# Patient Record
Sex: Female | Born: 1980
Health system: Southern US, Community
[De-identification: ages and names within clinical notes are randomized; demographics above are authoritative.]

## PROBLEM LIST (undated history)

## (undated) DIAGNOSIS — K219 Gastro-esophageal reflux disease without esophagitis: Secondary | ICD-10-CM

## (undated) DIAGNOSIS — G4730 Sleep apnea, unspecified: Secondary | ICD-10-CM

## (undated) HISTORY — PX: LIPOMA EXCISION: SHX5283

## (undated) HISTORY — DX: Sleep apnea, unspecified: G47.30

## (undated) HISTORY — DX: Gastro-esophageal reflux disease without esophagitis: K21.9

---

## 2019-01-22 ENCOUNTER — Other Ambulatory Visit: Payer: Self-pay

## 2019-01-22 ENCOUNTER — Ambulatory Visit (INDEPENDENT_AMBULATORY_CARE_PROVIDER_SITE_OTHER): Payer: 59 | Admitting: Family Medicine

## 2019-01-22 ENCOUNTER — Encounter: Payer: Self-pay | Admitting: Family Medicine

## 2019-01-22 VITALS — BP 126/84 | HR 72 | Resp 16 | Ht 64.0 in | Wt 179.0 lb

## 2019-01-22 DIAGNOSIS — R1013 Epigastric pain: Secondary | ICD-10-CM | POA: Diagnosis not present

## 2019-01-22 DIAGNOSIS — R1012 Left upper quadrant pain: Secondary | ICD-10-CM

## 2019-01-22 DIAGNOSIS — R195 Other fecal abnormalities: Secondary | ICD-10-CM

## 2019-01-22 DIAGNOSIS — K219 Gastro-esophageal reflux disease without esophagitis: Secondary | ICD-10-CM

## 2019-01-22 LAB — COMPREHENSIVE METABOLIC PANEL
ALT: 11 U/L (ref 0–35)
AST: 11 U/L (ref 0–37)
Albumin: 4.4 g/dL (ref 3.5–5.2)
Alkaline Phosphatase: 37 U/L — ABNORMAL LOW (ref 39–117)
BUN: 9 mg/dL (ref 6–23)
CO2: 31 mEq/L (ref 19–32)
Calcium: 9.8 mg/dL (ref 8.4–10.5)
Chloride: 101 mEq/L (ref 96–112)
Creatinine, Ser: 0.69 mg/dL (ref 0.40–1.20)
GFR: 96.25 mL/min (ref >60.00)
Glucose, Bld: 87 mg/dL (ref 70–99)
Potassium: 4.4 mEq/L (ref 3.5–5.1)
Sodium: 138 mEq/L (ref 135–145)
Total Bilirubin: 0.4 mg/dL (ref 0.2–1.2)
Total Protein: 7.2 g/dL (ref 6.0–8.3)

## 2019-01-22 LAB — CBC WITH DIFFERENTIAL/PLATELET
Basophils Absolute: 0 10*3/uL (ref 0.0–0.1)
Basophils Relative: 0.4 % (ref 0.0–3.0)
Eosinophils Absolute: 0.1 10*3/uL (ref 0.0–0.7)
Eosinophils Relative: 1.6 % (ref 0.0–5.0)
HCT: 36.8 % (ref 36.0–46.0)
Hemoglobin: 12.3 g/dL (ref 12.0–15.0)
Lymphocytes Relative: 32.8 % (ref 12.0–46.0)
Lymphs Abs: 1.6 10*3/uL (ref 0.7–4.0)
MCHC: 33.5 g/dL (ref 30.0–36.0)
MCV: 79.5 fl (ref 78.0–100.0)
Monocytes Absolute: 0.3 10*3/uL (ref 0.1–1.0)
Monocytes Relative: 6.4 % (ref 3.0–12.0)
Neutro Abs: 2.9 10*3/uL (ref 1.4–7.7)
Neutrophils Relative %: 58.8 % (ref 43.0–77.0)
Platelets: 402 10*3/uL — ABNORMAL HIGH (ref 150.0–400.0)
RBC: 4.63 Mil/uL (ref 3.87–5.11)
RDW: 14.4 % (ref 11.5–15.5)
WBC: 5 10*3/uL (ref 4.0–10.5)

## 2019-01-22 LAB — LIPASE: Lipase: 13 U/L (ref 11.0–59.0)

## 2019-01-22 MED ORDER — PANTOPRAZOLE SODIUM 20 MG PO TBEC: 20.0000 mg | DELAYED_RELEASE_TABLET | Freq: Every day | ORAL | 1 refills | Status: DC

## 2019-01-22 MED FILL — PANTOPRAZOLE SOD DR 20 MG T: 20 | 90 days supply | Qty: 90 | Fill #0

## 2019-01-22 NOTE — Progress Notes (Signed)
Subjective  CC:  Chief Complaint  Patient presents with  . Abdominal Pain    Started about 2 weeks ago.. Reports that initally thought it was due to food she had ate, had had some vomiting, mostly left & mid abdominal.. Has tried Mag Citrate, & Preg test neg.. Reports BMs had been watery and are now formed.    HPI: Pamela Simpson is a 38 y.o. female who presents to [place] [facility] today due to above. She is new to the area and in the processing of establishing with a pcp.  She has the following concerns or needs:  Has mild h/o GERD self treated w/ protonix although ran out several months ago. Started prilosec intermittently over the last few weeks.  2 weeks ago acute onset of dull aching midepigastric pain/discomfort associated with upset stomach and one episode of vomiting. Then with loose stools, w/o mucous or blood. Also with intermittent LUQ pain and some postprandial discomfort. No further n/v or diarrhea. Actually didn't have a BM for a few days so took Bloomer with some results. Stools are now normalizing but not yet completely formed. Did have some abdominal cramping. No h/o IBS. She drinks occasionally. No nsaids. + stressors due to work environment. No f/c/s. No fam h/o inflammatory bowel disease. No melena. Appetite is normal. No h/o abdominal surgeries.   Assessment  1. Midepigastric pain   2. LUQ pain   3. Loose stools   4. Gastroesophageal reflux disease, esophagitis presence not specified      Plan   Symptoms complex could be due to gastritis but pancreatitis, biliary colic or other are also in the differenetial. Benign abdomen now. Treat with protonix, check labs, immodium or probiotics if needed and consider ultrasound if not improving in 2 weeks or if labs suggest need. Patient understands and agrees with care plan.    Follow up:  prn Orders Placed This Encounter  Procedures  . CBC with Differential/Platelet  . Comprehensive metabolic  panel  . Lipase   Meds ordered this encounter  Medications  . pantoprazole (PROTONIX) 20 MG tablet    Sig: Take 1 tablet (20 mg total) by mouth daily.    Dispense:  90 tablet    Refill:  1     No flowsheet data found.  We updated and reviewed the patient's past history in detail and it is documented below.  Patient Active Problem List   Diagnosis Date Noted  . GERD (gastroesophageal reflux disease)    Health Maintenance  Topic Date Due  . HIV Screening  01/12/1998  . TETANUS/TDAP  01/12/2002  . PAP SMEAR-Modifier  01/13/2004  . INFLUENZA VACCINE  12/21/2018    There is no immunization history on file for this patient. Current Meds  Medication Sig  . Multiple Vitamin (MULTIVITAMIN) capsule Take 1 capsule by mouth daily.    Allergies: Patient has No Known Allergies. Past Medical History Patient  has a past medical history of GERD (gastroesophageal reflux disease). Past Surgical History Patient  has a past surgical history that includes Lipoma excision. Family History: Patient family history includes Healthy in her mother and sister. Social History:  Patient  reports that she has never smoked. She has never used smokeless tobacco. She reports current alcohol use. She reports that she does not use drugs.  Review of Systems: Constitutional: negative for fever or malaise Ophthalmic: negative for photophobia, double vision or loss of vision Cardiovascular: negative for chest pain, dyspnea on exertion, or  new LE swelling Respiratory: negative for SOB or persistent cough Genitourinary: negative for dysuria or gross hematuria Musculoskeletal: negative for new gait disturbance or muscular weakness Integumentary: negative for new or persistent rashes Neurological: negative for TIA or stroke symptoms Psychiatric: negative for SI or delusions Allergic/Immunologic: negative for hives  No care team member to display  Objective  Vitals: BP 126/84   Pulse 72   Resp 16   Ht  5\' 4"  (1.626 m)   Wt 179 lb (81.2 kg)   LMP 01/11/2019   SpO2 98%   BMI 30.73 kg/m  General:  Well developed, well nourished, no acute distress  Psych:  Alert and oriented,normal mood and affect HEENT:  Normocephalic, atraumatic, non-icteric sclera, PERRL, oropharynx is without mass or exudate, supple neck without adenopathy, mass or thyromegaly Cardiovascular:  RRR without gallop, rub or murmur, nondisplaced PMI Respiratory:  Good breath sounds bilaterally, CTAB with normal respiratory effort Gastrointestinal: normal bowel sounds, soft, mild midepigastric and LUQ ttp w/o rebound or guarding, no noted masses. No HSM MSK: no deformities, contusions. Joints are without erythema or swelling Skin:  Warm, no rashes or suspicious lesions noted Neurologic:    Mental status is normal. Gross motor and sensory exams are normal. Normal gait   Commons side effects, risks, benefits, and alternatives for medications and treatment plan prescribed today were discussed, and the patient expressed understanding of the given instructions. Patient is instructed to call or message via MyChart if he/she has any questions or concerns regarding our treatment plan. No barriers to understanding were identified. We discussed Red Flag symptoms and signs in detail. Patient expressed understanding regarding what to do in case of urgent or emergency type symptoms.   Medication list was reconciled, printed and provided to the patient in AVS. Patient instructions and summary information was reviewed with the patient as documented in the AVS. This note was prepared with assistance of Dragon voice recognition software. Occasional wrong-word or sound-a-like substitutions may have occurred due to the inherent limitations of voice recognition software

## 2019-01-22 NOTE — Patient Instructions (Signed)
Please return in 2 weeks if your symptoms are not resolving.   I will release your lab results to you on your MyChart account with further instructions. Please reply with any questions.    If you have any questions or concerns, please don't hesitate to send me a message via MyChart or call the office at [phone]. Thank you for visiting with Pamela Simpson today! It's our pleasure caring for you.   Abdominal Pain, Adult Abdominal pain can be caused by many things. Often, abdominal pain is not serious and it gets better with no treatment or by being treated at home. However, sometimes abdominal pain is serious. Your health care provider will do a medical history and a physical exam to try to determine the cause of your abdominal pain. Follow these instructions at home:  Take over-the-counter and prescription medicines only as told by your health care provider. Do not take a laxative unless told by your health care provider.  Drink enough fluid to keep your urine clear or pale yellow.  Watch your condition for any changes.  Keep all follow-up visits as told by your health care provider. This is important. Contact a health care provider if:  Your abdominal pain changes or gets worse.  You are not hungry or you lose weight without trying.  You are constipated or have diarrhea for more than 2-3 days.  You have pain when you urinate or have a bowel movement.  Your abdominal pain wakes you up at night.  Your pain gets worse with meals, after eating, or with certain foods.  You are throwing up and cannot keep anything down.  You have a fever. Get help right away if:  Your pain does not go away as soon as your health care provider told you to expect.  You cannot stop throwing up.  Your pain is only in areas of the abdomen, such as the right side or the left lower portion of the abdomen.  You have bloody or black stools, or stools that look like tar.  You have severe pain, cramping, or  bloating in your abdomen.  You have signs of dehydration, such as: ? Dark urine, very little urine, or no urine. ? Cracked lips. ? Dry mouth. ? Sunken eyes. ? Sleepiness. ? Weakness. This information is not intended to replace advice given to you by your health care provider. Make sure you discuss any questions you have with your health care provider. Document Released: 02/15/2005 Document Revised: 11/26/2015 Document Reviewed: 10/20/2015 Elsevier Interactive Patient Education  [employer].

## 2019-01-28 ENCOUNTER — Encounter: Payer: Self-pay | Admitting: Family Medicine

## 2019-01-28 DIAGNOSIS — R195 Other fecal abnormalities: Secondary | ICD-10-CM

## 2019-01-28 DIAGNOSIS — R1012 Left upper quadrant pain: Secondary | ICD-10-CM

## 2019-01-28 DIAGNOSIS — R1013 Epigastric pain: Secondary | ICD-10-CM

## 2019-02-06 ENCOUNTER — Ambulatory Visit
Admission: RE | Admit: 2019-02-06 | Discharge: 2019-02-06 | Disposition: A | Discharge status: Home / Self Care | Payer: 59 | Source: Ambulatory Visit | Attending: Family Medicine | Admitting: Family Medicine

## 2019-02-06 DIAGNOSIS — R1011 Right upper quadrant pain: Secondary | ICD-10-CM | POA: Diagnosis not present

## 2019-02-06 DIAGNOSIS — R1013 Epigastric pain: Secondary | ICD-10-CM

## 2019-02-06 DIAGNOSIS — R195 Other fecal abnormalities: Secondary | ICD-10-CM

## 2019-02-06 DIAGNOSIS — R1012 Left upper quadrant pain: Secondary | ICD-10-CM

## 2019-02-21 ENCOUNTER — Telehealth: Payer: Self-pay | Admitting: Family Medicine

## 2019-02-21 DIAGNOSIS — L03011 Cellulitis of right finger: Secondary | ICD-10-CM

## 2019-02-21 MED ORDER — CEPHALEXIN 500 MG PO CAPS: 500.0000 mg | ORAL_CAPSULE | Freq: Two times a day (BID) | ORAL | 0 refills | Status: DC

## 2019-02-21 MED FILL — CEPHALEXIN 500 MG CAPSULE: 500 | 7 days supply | Qty: 14 | Fill #0

## 2019-02-21 NOTE — Telephone Encounter (Signed)
Finger with redness and swelling around lateral edge. Now with mild pus drainage.   Educated and ordered keflex.

## 2019-03-10 DIAGNOSIS — R102 Pelvic and perineal pain: Secondary | ICD-10-CM | POA: Diagnosis not present

## 2019-03-10 DIAGNOSIS — E663 Overweight: Secondary | ICD-10-CM | POA: Diagnosis not present

## 2019-03-10 DIAGNOSIS — N898 Other specified noninflammatory disorders of vagina: Secondary | ICD-10-CM | POA: Diagnosis not present

## 2019-03-10 DIAGNOSIS — Z13220 Encounter for screening for lipoid disorders: Secondary | ICD-10-CM | POA: Diagnosis not present

## 2019-03-10 DIAGNOSIS — Z0000 Encounter for general adult medical examination without abnormal findings: Secondary | ICD-10-CM | POA: Diagnosis not present

## 2019-03-12 DIAGNOSIS — N946 Dysmenorrhea, unspecified: Secondary | ICD-10-CM | POA: Diagnosis not present

## 2019-03-12 DIAGNOSIS — N839 Noninflammatory disorder of ovary, fallopian tube and broad ligament, unspecified: Secondary | ICD-10-CM | POA: Diagnosis not present

## 2019-03-12 DIAGNOSIS — R102 Pelvic and perineal pain: Secondary | ICD-10-CM | POA: Diagnosis not present

## 2019-03-13 DIAGNOSIS — D649 Anemia, unspecified: Secondary | ICD-10-CM | POA: Diagnosis not present

## 2019-03-27 MED FILL — IBUPROFEN 800 MG TABS: 800 | 5 days supply | Qty: 15 | Fill #0

## 2019-04-08 DIAGNOSIS — R109 Unspecified abdominal pain: Secondary | ICD-10-CM | POA: Diagnosis not present

## 2019-04-08 MED FILL — PANTOPRAZOLE SOD DR 40 MG T: 40 | 30 days supply | Qty: 30 | Fill #0

## 2019-05-12 DIAGNOSIS — Z3201 Encounter for pregnancy test, result positive: Secondary | ICD-10-CM | POA: Diagnosis not present

## 2019-05-12 DIAGNOSIS — N910 Primary amenorrhea: Secondary | ICD-10-CM | POA: Diagnosis not present

## 2019-05-12 DIAGNOSIS — N83299 Other ovarian cyst, unspecified side: Secondary | ICD-10-CM | POA: Diagnosis not present

## 2019-05-13 DIAGNOSIS — O09511 Supervision of elderly primigravida, first trimester: Secondary | ICD-10-CM | POA: Diagnosis not present

## 2019-05-13 DIAGNOSIS — O3480 Maternal care for other abnormalities of pelvic organs, unspecified trimester: Secondary | ICD-10-CM | POA: Diagnosis not present

## 2019-06-06 DIAGNOSIS — Z3401 Encounter for supervision of normal first pregnancy, first trimester: Secondary | ICD-10-CM | POA: Diagnosis not present

## 2019-06-06 DIAGNOSIS — Z3201 Encounter for pregnancy test, result positive: Secondary | ICD-10-CM | POA: Diagnosis not present

## 2019-06-06 DIAGNOSIS — Z3400 Encounter for supervision of normal first pregnancy, unspecified trimester: Secondary | ICD-10-CM | POA: Diagnosis not present

## 2019-06-06 LAB — OB RESULTS CONSOLE HEPATITIS B SURFACE ANTIGEN: Hepatitis B Surface Ag: NEGATIVE

## 2019-06-06 LAB — OB RESULTS CONSOLE GC/CHLAMYDIA
Chlamydia: NEGATIVE
Gonorrhea: NEGATIVE

## 2019-06-06 LAB — OB RESULTS CONSOLE RUBELLA ANTIBODY, IGM: Rubella: IMMUNE

## 2019-06-06 LAB — OB RESULTS CONSOLE ABO/RH: RH Type: POSITIVE

## 2019-06-06 LAB — OB RESULTS CONSOLE ANTIBODY SCREEN: Antibody Screen: NEGATIVE

## 2019-06-06 LAB — OB RESULTS CONSOLE HIV ANTIBODY (ROUTINE TESTING): HIV: NONREACTIVE

## 2019-06-06 LAB — OB RESULTS CONSOLE RPR: RPR: NONREACTIVE

## 2019-06-09 DIAGNOSIS — Z113 Encounter for screening for infections with a predominantly sexual mode of transmission: Secondary | ICD-10-CM | POA: Diagnosis not present

## 2019-06-09 DIAGNOSIS — Z124 Encounter for screening for malignant neoplasm of cervix: Secondary | ICD-10-CM | POA: Diagnosis not present

## 2019-06-09 MED FILL — ONDANSETRON HCL 4 MG TABLET: 4 | 7 days supply | Qty: 21 | Fill #0

## 2019-06-10 MED FILL — DOXYLAMINE-PYRIDOXINE 10-10: 10-10 | 25 days supply | Qty: 100 | Fill #0

## 2019-06-23 DIAGNOSIS — O09511 Supervision of elderly primigravida, first trimester: Secondary | ICD-10-CM | POA: Diagnosis not present

## 2019-06-23 DIAGNOSIS — O3411 Maternal care for benign tumor of corpus uteri, first trimester: Secondary | ICD-10-CM | POA: Diagnosis not present

## 2019-06-23 DIAGNOSIS — N83202 Unspecified ovarian cyst, left side: Secondary | ICD-10-CM | POA: Diagnosis not present

## 2019-07-16 DIAGNOSIS — J343 Hypertrophy of nasal turbinates: Secondary | ICD-10-CM | POA: Diagnosis not present

## 2019-07-16 DIAGNOSIS — J342 Deviated nasal septum: Secondary | ICD-10-CM | POA: Diagnosis not present

## 2019-07-16 DIAGNOSIS — J310 Chronic rhinitis: Secondary | ICD-10-CM | POA: Diagnosis not present

## 2019-07-18 MED FILL — FLUTICASONE PROP 50 MCG SPR: 50 | 90 days supply | Qty: 48 | Fill #0

## 2019-07-21 DIAGNOSIS — O0992 Supervision of high risk pregnancy, unspecified, second trimester: Secondary | ICD-10-CM | POA: Diagnosis not present

## 2019-07-21 DIAGNOSIS — R300 Dysuria: Secondary | ICD-10-CM | POA: Diagnosis not present

## 2019-07-24 MED FILL — LEVOCETIRIZINE 5 MG TABLET: 5 | 90 days supply | Qty: 90 | Fill #0

## 2019-08-01 MED FILL — DOXYLAMINE-PYRIDOXINE 10-10: 10-10 | 25 days supply | Qty: 100 | Fill #1

## 2019-08-13 DIAGNOSIS — O09512 Supervision of elderly primigravida, second trimester: Secondary | ICD-10-CM | POA: Diagnosis not present

## 2019-08-13 DIAGNOSIS — Z3402 Encounter for supervision of normal first pregnancy, second trimester: Secondary | ICD-10-CM | POA: Diagnosis not present

## 2019-08-13 DIAGNOSIS — Z36 Encounter for antenatal screening for chromosomal anomalies: Secondary | ICD-10-CM | POA: Diagnosis not present

## 2019-08-14 ENCOUNTER — Other Ambulatory Visit: Payer: Self-pay | Admitting: Obstetrics and Gynecology

## 2019-08-14 DIAGNOSIS — N6320 Unspecified lump in the left breast, unspecified quadrant: Secondary | ICD-10-CM

## 2019-09-03 ENCOUNTER — Other Ambulatory Visit: Payer: Self-pay | Admitting: Obstetrics and Gynecology

## 2019-09-03 ENCOUNTER — Other Ambulatory Visit: Payer: Self-pay

## 2019-09-03 ENCOUNTER — Ambulatory Visit
Admission: RE | Admit: 2019-09-03 | Discharge: 2019-09-03 | Disposition: A | Discharge status: Home / Self Care | Payer: 59 | Source: Ambulatory Visit | Attending: Obstetrics and Gynecology | Admitting: Obstetrics and Gynecology

## 2019-09-03 DIAGNOSIS — N6320 Unspecified lump in the left breast, unspecified quadrant: Secondary | ICD-10-CM

## 2019-09-03 DIAGNOSIS — N6324 Unspecified lump in the left breast, lower inner quadrant: Secondary | ICD-10-CM | POA: Diagnosis not present

## 2019-09-15 ENCOUNTER — Ambulatory Visit
Admission: RE | Admit: 2019-09-15 | Discharge: 2019-09-15 | Disposition: A | Discharge status: Home / Self Care | Payer: 59 | Source: Ambulatory Visit | Attending: Obstetrics and Gynecology | Admitting: Obstetrics and Gynecology

## 2019-09-15 ENCOUNTER — Other Ambulatory Visit: Payer: Self-pay

## 2019-09-15 ENCOUNTER — Other Ambulatory Visit: Payer: Self-pay | Admitting: Obstetrics and Gynecology

## 2019-09-15 DIAGNOSIS — N6320 Unspecified lump in the left breast, unspecified quadrant: Secondary | ICD-10-CM

## 2019-09-15 DIAGNOSIS — D242 Benign neoplasm of left breast: Secondary | ICD-10-CM | POA: Diagnosis not present

## 2019-09-15 DIAGNOSIS — N6324 Unspecified lump in the left breast, lower inner quadrant: Secondary | ICD-10-CM | POA: Diagnosis not present

## 2019-09-15 DIAGNOSIS — N6323 Unspecified lump in the left breast, lower outer quadrant: Secondary | ICD-10-CM | POA: Diagnosis not present

## 2019-10-13 DIAGNOSIS — O09523 Supervision of elderly multigravida, third trimester: Secondary | ICD-10-CM | POA: Diagnosis not present

## 2019-10-13 DIAGNOSIS — Z113 Encounter for screening for infections with a predominantly sexual mode of transmission: Secondary | ICD-10-CM | POA: Diagnosis not present

## 2019-10-13 DIAGNOSIS — O0993 Supervision of high risk pregnancy, unspecified, third trimester: Secondary | ICD-10-CM | POA: Diagnosis not present

## 2019-10-14 MED FILL — INTEGRA CAPSULE: 62.5-62.5-4 | 30 days supply | Qty: 30 | Fill #0

## 2019-10-23 DIAGNOSIS — M62830 Muscle spasm of back: Secondary | ICD-10-CM | POA: Diagnosis not present

## 2019-10-23 DIAGNOSIS — M9903 Segmental and somatic dysfunction of lumbar region: Secondary | ICD-10-CM | POA: Diagnosis not present

## 2019-10-23 DIAGNOSIS — M9902 Segmental and somatic dysfunction of thoracic region: Secondary | ICD-10-CM | POA: Diagnosis not present

## 2019-10-23 DIAGNOSIS — M9905 Segmental and somatic dysfunction of pelvic region: Secondary | ICD-10-CM | POA: Diagnosis not present

## 2019-10-30 DIAGNOSIS — M9905 Segmental and somatic dysfunction of pelvic region: Secondary | ICD-10-CM | POA: Diagnosis not present

## 2019-10-30 DIAGNOSIS — M9903 Segmental and somatic dysfunction of lumbar region: Secondary | ICD-10-CM | POA: Diagnosis not present

## 2019-10-30 DIAGNOSIS — M62830 Muscle spasm of back: Secondary | ICD-10-CM | POA: Diagnosis not present

## 2019-10-30 DIAGNOSIS — M9902 Segmental and somatic dysfunction of thoracic region: Secondary | ICD-10-CM | POA: Diagnosis not present

## 2019-11-12 MED FILL — INTEGRA CAPSULE: 62.5-62.5-4 | 30 days supply | Qty: 30 | Fill #1

## 2019-11-13 DIAGNOSIS — M9903 Segmental and somatic dysfunction of lumbar region: Secondary | ICD-10-CM | POA: Diagnosis not present

## 2019-11-13 DIAGNOSIS — M9902 Segmental and somatic dysfunction of thoracic region: Secondary | ICD-10-CM | POA: Diagnosis not present

## 2019-11-13 DIAGNOSIS — M9905 Segmental and somatic dysfunction of pelvic region: Secondary | ICD-10-CM | POA: Diagnosis not present

## 2019-11-13 DIAGNOSIS — M62830 Muscle spasm of back: Secondary | ICD-10-CM | POA: Diagnosis not present

## 2019-11-26 ENCOUNTER — Other Ambulatory Visit (HOSPITAL_COMMUNITY): Payer: Self-pay | Admitting: Obstetrics and Gynecology

## 2019-11-26 DIAGNOSIS — Z23 Encounter for immunization: Secondary | ICD-10-CM | POA: Diagnosis not present

## 2019-11-26 DIAGNOSIS — O09523 Supervision of elderly multigravida, third trimester: Secondary | ICD-10-CM | POA: Diagnosis not present

## 2019-11-26 DIAGNOSIS — K219 Gastro-esophageal reflux disease without esophagitis: Secondary | ICD-10-CM | POA: Diagnosis not present

## 2019-11-27 DIAGNOSIS — M9902 Segmental and somatic dysfunction of thoracic region: Secondary | ICD-10-CM | POA: Diagnosis not present

## 2019-11-27 DIAGNOSIS — M9903 Segmental and somatic dysfunction of lumbar region: Secondary | ICD-10-CM | POA: Diagnosis not present

## 2019-11-27 DIAGNOSIS — M62830 Muscle spasm of back: Secondary | ICD-10-CM | POA: Diagnosis not present

## 2019-11-27 DIAGNOSIS — M9905 Segmental and somatic dysfunction of pelvic region: Secondary | ICD-10-CM | POA: Diagnosis not present

## 2019-11-27 MED FILL — PANTOPRAZOLE SOD DR 40 MG T: 40 | 90 days supply | Qty: 90 | Fill #0

## 2019-12-08 DIAGNOSIS — O0993 Supervision of high risk pregnancy, unspecified, third trimester: Secondary | ICD-10-CM | POA: Diagnosis not present

## 2019-12-10 LAB — OB RESULTS CONSOLE GBS: GBS: NEGATIVE

## 2019-12-11 DIAGNOSIS — M9905 Segmental and somatic dysfunction of pelvic region: Secondary | ICD-10-CM | POA: Diagnosis not present

## 2019-12-11 DIAGNOSIS — M9902 Segmental and somatic dysfunction of thoracic region: Secondary | ICD-10-CM | POA: Diagnosis not present

## 2019-12-11 DIAGNOSIS — M9903 Segmental and somatic dysfunction of lumbar region: Secondary | ICD-10-CM | POA: Diagnosis not present

## 2019-12-11 DIAGNOSIS — M62830 Muscle spasm of back: Secondary | ICD-10-CM | POA: Diagnosis not present

## 2019-12-23 MED FILL — INTEGRA CAPSULE: 62.5-62.5-4 | 30 days supply | Qty: 30 | Fill #2

## 2019-12-26 ENCOUNTER — Encounter (HOSPITAL_COMMUNITY): Payer: Self-pay | Admitting: *Deleted

## 2019-12-26 ENCOUNTER — Telehealth (HOSPITAL_COMMUNITY): Payer: Self-pay | Admitting: *Deleted

## 2019-12-26 NOTE — Telephone Encounter (Signed)
Preadmission screen  

## 2019-12-30 DIAGNOSIS — O09523 Supervision of elderly multigravida, third trimester: Secondary | ICD-10-CM | POA: Diagnosis not present

## 2020-01-02 DIAGNOSIS — O0993 Supervision of high risk pregnancy, unspecified, third trimester: Secondary | ICD-10-CM | POA: Diagnosis not present

## 2020-01-06 ENCOUNTER — Inpatient Hospital Stay (HOSPITAL_COMMUNITY): Admission: RE | Admit: 2020-01-06 | Payer: 59 | Source: Home / Self Care

## 2020-01-06 DIAGNOSIS — O09513 Supervision of elderly primigravida, third trimester: Secondary | ICD-10-CM | POA: Diagnosis not present

## 2020-01-06 DIAGNOSIS — Z3A40 40 weeks gestation of pregnancy: Secondary | ICD-10-CM | POA: Diagnosis not present

## 2020-01-07 ENCOUNTER — Other Ambulatory Visit (HOSPITAL_COMMUNITY)
Admission: RE | Admit: 2020-01-07 | Discharge: 2020-01-07 | Disposition: A | Discharge status: Home / Self Care | Payer: 59 | Source: Ambulatory Visit | Attending: Obstetrics and Gynecology | Admitting: Obstetrics and Gynecology

## 2020-01-07 DIAGNOSIS — Z01812 Encounter for preprocedural laboratory examination: Secondary | ICD-10-CM | POA: Insufficient documentation

## 2020-01-07 DIAGNOSIS — D573 Sickle-cell trait: Secondary | ICD-10-CM | POA: Diagnosis not present

## 2020-01-07 DIAGNOSIS — O480 Post-term pregnancy: Secondary | ICD-10-CM | POA: Diagnosis not present

## 2020-01-07 DIAGNOSIS — D259 Leiomyoma of uterus, unspecified: Secondary | ICD-10-CM | POA: Diagnosis not present

## 2020-01-07 DIAGNOSIS — O3413 Maternal care for benign tumor of corpus uteri, third trimester: Secondary | ICD-10-CM | POA: Diagnosis not present

## 2020-01-07 DIAGNOSIS — Z20822 Contact with and (suspected) exposure to covid-19: Secondary | ICD-10-CM | POA: Diagnosis not present

## 2020-01-07 DIAGNOSIS — O9902 Anemia complicating childbirth: Secondary | ICD-10-CM | POA: Diagnosis not present

## 2020-01-07 LAB — SARS CORONAVIRUS 2 (TAT 6-24 HRS): SARS Coronavirus 2: NEGATIVE

## 2020-01-08 NOTE — H&P (Signed)
Pamela Simpson is a 39 y.o. female G1 @ 13 2/7 weeks presenting for IOL due to [place], nearing postdates.  Pt was without complaint on 01/07/20, denied LOF, VB, contractions.  Good fetal movement noted.  Reassuring BPP on 01/06/20.  [place] [facility] has been complicated by: AMA, normal genetic screening [place] Trait H/o Mild sleep apnea, resolved prior to pregnancy with weight loss. Small fibroids, largest 4 cm, posterior New Breast mass (BIRADS 4), Biopsy showed benign mass.  OB History    Gravida  1   Para      Term      Preterm      AB      Living        SAB      TAB      Ectopic      Multiple      Live Births             Past Medical History:  Diagnosis Date  . GERD (gastroesophageal reflux disease)    Past Surgical History:  Procedure Laterality Date  . LIPOMA EXCISION     Family History: family history includes Diabetes in her father; Healthy in her sister; Heart attack in her mother; Stroke in her father. Social History:  reports that she has never smoked. She has never used smokeless tobacco. She reports current alcohol use. She reports that she does not use drugs.     Maternal Diabetes: No Genetic Screening: Normal Maternal Ultrasounds/Referrals: Normal Fetal Ultrasounds or other Referrals:  None Maternal Substance Abuse:  No Significant Maternal Medications:  None Significant Maternal Lab Results:  Group B Strep negative Other Comments:  AMA, [place] trait (maternal)  Review of Systems  Gastrointestinal: Negative for abdominal pain.   Maternal Medical History:  Contractions: Frequency: rare.    Fetal activity: Perceived fetal activity is normal.    Prenatal complications: no prenatal complications Prenatal Complications - Diabetes: none.      Last menstrual period 01/11/2019. Maternal Exam:  Uterine Assessment: Contraction frequency is rare.   Abdomen: Estimated fetal weight is 8 lb 6 oz +/-20 oz on 01/06/20.   Fetal presentation:  vertex  Introitus: Normal vulva. Pelvis: adequate for delivery.   Cervix: Cervix evaluated by digital exam.     Fetal Exam Fetal Monitor Review: Mode: ultrasound.   Baseline rate: 137.   Fetal State Assessment: BPP 8/8 on 01/06/20  Physical Exam Constitutional:      Appearance: Normal appearance.  HENT:     Head: Normocephalic and atraumatic.  Pulmonary:     Effort: Pulmonary effort is normal. No respiratory distress.  Abdominal:     Tenderness: There is no abdominal tenderness.  Genitourinary:    General: Normal vulva.  Musculoskeletal:        General: Normal range of motion.     Cervical back: Normal range of motion.  Skin:    General: Skin is warm and dry.  Neurological:     General: No focal deficit present.     Mental Status: She is alert and oriented to person, place, and time.  Psychiatric:        Mood and Affect: Mood normal.     Prenatal labs: ABO, Rh: A/Positive/-- (01/15 0000) Antibody: Negative (01/15 0000) Rubella: Immune (01/15 0000) RPR: Nonreactive (01/15 0000)  HBsAg: Negative (01/15 0000)  HIV: Non-reactive (01/15 0000)  GBS: Negative/-- (07/21 0000)   Assessment/Plan: IUP @ 40 2/7 weeks on admit.  Cytotec for cervical ripening. AMA [place] trait  Fibroids-of no concern for delivery H/o Sleep apnea-resolved  Pt counseled on IOL.  All questions answered.    Thurnell Lose 01/08/2020, 11:57 PM

## 2020-01-09 ENCOUNTER — Other Ambulatory Visit: Payer: Self-pay

## 2020-01-09 ENCOUNTER — Inpatient Hospital Stay (HOSPITAL_COMMUNITY): Payer: 59 | Admitting: Anesthesiology

## 2020-01-09 ENCOUNTER — Inpatient Hospital Stay (HOSPITAL_COMMUNITY)
Admission: AD | Admit: 2020-01-09 | Discharge: 2020-01-13 | DRG: 788 | Disposition: A | Discharge status: Home / Self Care | Payer: 59 | Attending: Obstetrics and Gynecology | Admitting: Obstetrics and Gynecology

## 2020-01-09 ENCOUNTER — Encounter (HOSPITAL_COMMUNITY): Payer: Self-pay | Admitting: Obstetrics and Gynecology

## 2020-01-09 ENCOUNTER — Inpatient Hospital Stay (HOSPITAL_COMMUNITY): Payer: 59

## 2020-01-09 DIAGNOSIS — Z3A40 40 weeks gestation of pregnancy: Secondary | ICD-10-CM | POA: Diagnosis not present

## 2020-01-09 DIAGNOSIS — D573 Sickle-cell trait: Secondary | ICD-10-CM | POA: Diagnosis present

## 2020-01-09 DIAGNOSIS — O480 Post-term pregnancy: Principal | ICD-10-CM | POA: Diagnosis present

## 2020-01-09 DIAGNOSIS — O3413 Maternal care for benign tumor of corpus uteri, third trimester: Secondary | ICD-10-CM | POA: Diagnosis present

## 2020-01-09 DIAGNOSIS — O09521 Supervision of elderly multigravida, first trimester: Secondary | ICD-10-CM | POA: Diagnosis not present

## 2020-01-09 DIAGNOSIS — O09513 Supervision of elderly primigravida, third trimester: Secondary | ICD-10-CM | POA: Diagnosis present

## 2020-01-09 DIAGNOSIS — O322XX0 Maternal care for transverse and oblique lie, not applicable or unspecified: Secondary | ICD-10-CM | POA: Diagnosis not present

## 2020-01-09 DIAGNOSIS — D259 Leiomyoma of uterus, unspecified: Secondary | ICD-10-CM | POA: Diagnosis present

## 2020-01-09 DIAGNOSIS — Z3A00 Weeks of gestation of pregnancy not specified: Secondary | ICD-10-CM | POA: Diagnosis not present

## 2020-01-09 DIAGNOSIS — O411290 Chorioamnionitis, unspecified trimester, not applicable or unspecified: Secondary | ICD-10-CM | POA: Diagnosis not present

## 2020-01-09 DIAGNOSIS — O9902 Anemia complicating childbirth: Secondary | ICD-10-CM | POA: Diagnosis present

## 2020-01-09 DIAGNOSIS — Z20822 Contact with and (suspected) exposure to covid-19: Secondary | ICD-10-CM | POA: Diagnosis present

## 2020-01-09 LAB — TYPE AND SCREEN
ABO/RH(D): A POS
Antibody Screen: NEGATIVE
PT AG Type: NEGATIVE

## 2020-01-09 LAB — CBC
HCT: 36 % (ref 36.0–46.0)
Hemoglobin: 11.9 g/dL — ABNORMAL LOW (ref 12.0–15.0)
MCH: 25.8 pg — ABNORMAL LOW (ref 26.0–34.0)
MCHC: 33.1 g/dL (ref 30.0–36.0)
MCV: 77.9 fL — ABNORMAL LOW (ref 80.0–100.0)
Platelets: 385 10*3/uL (ref 150–400)
RBC: 4.62 MIL/uL (ref 3.87–5.11)
RDW: 16.1 % — ABNORMAL HIGH (ref 11.5–15.5)
WBC: 8.4 10*3/uL (ref 4.0–10.5)
nRBC: 0 % (ref 0.0–0.2)

## 2020-01-09 MED ORDER — FLEET ENEMA 7-19 GM/118ML RE ENEM: 1.0000 | ENEMA | RECTAL | Status: DC | PRN

## 2020-01-09 MED ORDER — MISOPROSTOL 25 MCG QUARTER TABLET
25.0000 ug | ORAL_TABLET | ORAL | Status: DC | PRN
  Administered 2020-01-09 (×2): 25 ug via VAGINAL
  Filled 2020-01-09 (×2): qty 1

## 2020-01-09 MED ORDER — OXYCODONE-ACETAMINOPHEN 5-325 MG PO TABS: 2.0000 | ORAL_TABLET | ORAL | Status: DC | PRN

## 2020-01-09 MED ORDER — LIDOCAINE HCL (PF) 1 % IJ SOLN: 30.0000 mL | INTRAMUSCULAR | Status: DC | PRN

## 2020-01-09 MED ORDER — OXYCODONE-ACETAMINOPHEN 5-325 MG PO TABS: 1.0000 | ORAL_TABLET | ORAL | Status: DC | PRN

## 2020-01-09 MED ORDER — OXYTOCIN-SODIUM CHLORIDE 30-0.9 UT/500ML-% IV SOLN
2.5000 [IU]/h | INTRAVENOUS | Status: DC
  Filled 2020-01-09: qty 500

## 2020-01-09 MED ORDER — PHENYLEPHRINE 40 MCG/ML (10ML) SYRINGE FOR IV PUSH (FOR BLOOD PRESSURE SUPPORT)
80.0000 ug | PREFILLED_SYRINGE | INTRAVENOUS | Status: AC | PRN
  Administered 2020-01-09 (×3): 80 ug via INTRAVENOUS

## 2020-01-09 MED ORDER — LACTATED RINGERS IV SOLN: INTRAVENOUS | Status: DC

## 2020-01-09 MED ORDER — SOD CITRATE-CITRIC ACID 500-334 MG/5ML PO SOLN
30.0000 mL | ORAL | Status: DC | PRN
  Filled 2020-01-09: qty 30

## 2020-01-09 MED ORDER — TERBUTALINE SULFATE 1 MG/ML IJ SOLN: 0.2500 mg | Freq: Once | INTRAMUSCULAR | Status: DC | PRN

## 2020-01-09 MED ORDER — EPHEDRINE 5 MG/ML INJ
10.0000 mg | INTRAVENOUS | Status: AC | PRN
  Administered 2020-01-09 (×2): 10 mg via INTRAVENOUS

## 2020-01-09 MED ORDER — PHENYLEPHRINE 40 MCG/ML (10ML) SYRINGE FOR IV PUSH (FOR BLOOD PRESSURE SUPPORT)
80.0000 ug | PREFILLED_SYRINGE | INTRAVENOUS | Status: DC | PRN
  Administered 2020-01-09: 80 ug via INTRAVENOUS
  Filled 2020-01-09: qty 10

## 2020-01-09 MED ORDER — FENTANYL CITRATE (PF) 100 MCG/2ML IJ SOLN: 50.0000 ug | INTRAMUSCULAR | Status: DC | PRN

## 2020-01-09 MED ORDER — EPINEPHRINE PF 1 MG/ML IJ SOLN
INTRAMUSCULAR | Status: AC
  Filled 2020-01-09: qty 1

## 2020-01-09 MED ORDER — EPHEDRINE 5 MG/ML INJ
10.0000 mg | INTRAVENOUS | Status: DC | PRN
  Filled 2020-01-09: qty 10

## 2020-01-09 MED ORDER — LIDOCAINE-EPINEPHRINE (PF) 2 %-1:200000 IJ SOLN
INTRAMUSCULAR | Status: DC | PRN
  Administered 2020-01-09: 2 mL via EPIDURAL
  Administered 2020-01-09: 1 mL via EPIDURAL
  Administered 2020-01-09: 3 mL via EPIDURAL
  Administered 2020-01-09: 4 mL via EPIDURAL
  Administered 2020-01-09: 3 mL via EPIDURAL
  Administered 2020-01-10 (×2): 5 mL via EPIDURAL
  Administered 2020-01-10: 2 mL via EPIDURAL

## 2020-01-09 MED ORDER — LACTATED RINGERS IV SOLN: 500.0000 mL | Freq: Once | INTRAVENOUS | Status: DC

## 2020-01-09 MED ORDER — ONDANSETRON HCL 4 MG/2ML IJ SOLN
4.0000 mg | Freq: Four times a day (QID) | INTRAMUSCULAR | Status: DC | PRN
  Administered 2020-01-09 – 2020-01-10 (×2): 4 mg via INTRAVENOUS
  Filled 2020-01-09 (×2): qty 2

## 2020-01-09 MED ORDER — LACTATED RINGERS AMNIOINFUSION: INTRAVENOUS | Status: DC

## 2020-01-09 MED ORDER — OXYTOCIN BOLUS FROM INFUSION: 333.0000 mL | Freq: Once | INTRAVENOUS | Status: DC

## 2020-01-09 MED ORDER — LIDOCAINE HCL (PF) 1 % IJ SOLN
INTRAMUSCULAR | Status: DC | PRN
  Administered 2020-01-09: 3 mL via EPIDURAL
  Administered 2020-01-09: 9 mL via EPIDURAL

## 2020-01-09 MED ORDER — LACTATED RINGERS IV SOLN: 500.0000 mL | INTRAVENOUS | Status: DC | PRN

## 2020-01-09 MED ORDER — LIDOCAINE HCL (PF) 2 % IJ SOLN
INTRAMUSCULAR | Status: AC
  Filled 2020-01-09: qty 10

## 2020-01-09 MED ORDER — ZOLPIDEM TARTRATE 5 MG PO TABS: 5.0000 mg | ORAL_TABLET | Freq: Every evening | ORAL | Status: DC | PRN

## 2020-01-09 MED ORDER — ACETAMINOPHEN 325 MG PO TABS: 650.0000 mg | ORAL_TABLET | ORAL | Status: DC | PRN

## 2020-01-09 MED ORDER — FENTANYL CITRATE (PF) 2500 MCG/50ML IJ SOLN
INTRAMUSCULAR | Status: DC | PRN
Start: 2020-01-09 — End: 2020-01-10
  Administered 2020-01-09: 12 mL/h via EPIDURAL

## 2020-01-09 MED ORDER — TERBUTALINE SULFATE 1 MG/ML IJ SOLN
0.2500 mg | Freq: Once | INTRAMUSCULAR | Status: AC | PRN
  Administered 2020-01-09: 0.25 mg via SUBCUTANEOUS
  Filled 2020-01-09 (×2): qty 1

## 2020-01-09 MED ORDER — FENTANYL-BUPIVACAINE-NACL 0.5-0.125-0.9 MG/250ML-% EP SOLN
12.0000 mL/h | EPIDURAL | Status: DC | PRN
  Filled 2020-01-09 (×2): qty 250

## 2020-01-09 MED ORDER — OXYTOCIN-SODIUM CHLORIDE 30-0.9 UT/500ML-% IV SOLN
1.0000 m[IU]/min | INTRAVENOUS | Status: DC
  Administered 2020-01-09: 1 m[IU]/min via INTRAVENOUS

## 2020-01-09 MED ORDER — DIPHENHYDRAMINE HCL 50 MG/ML IJ SOLN: 12.5000 mg | INTRAMUSCULAR | Status: DC | PRN

## 2020-01-09 NOTE — Progress Notes (Signed)
Tracing reviewed remotely. Currently reassuring.  Decelerations and contractions resolved.

## 2020-01-09 NOTE — Anesthesia Procedure Notes (Signed)
Epidural Patient location during procedure: OB Start time: 01/09/2020 10:35 AM End time: 01/09/2020 10:37 AM  Staffing Anesthesiologist: Lyn Hollingshead, MD Performed: anesthesiologist   Preanesthetic Checklist Completed: patient identified, IV checked, site marked, risks and benefits discussed, surgical consent, monitors and equipment checked, pre-op evaluation and timeout performed  Epidural Patient position: sitting Prep: DuraPrep and site prepped and draped Patient monitoring: continuous pulse ox and blood pressure Approach: midline Location: L3-L4 Injection technique: LOR air  Needle:  Needle type: Tuohy  Needle gauge: 17 G Needle length: 9 cm and 9 Needle insertion depth: 5 cm cm Catheter type: closed end flexible Catheter size: 19 Gauge Catheter at skin depth: 10 cm Test dose: negative and Other  Assessment Events: blood not aspirated, injection not painful, no injection resistance, no paresthesia and negative IV test  Additional Notes Reason for block:procedure for pain

## 2020-01-09 NOTE — Progress Notes (Signed)
Called to the bedside due to variable and prolonged decelerations.  Epidural was redosed which resulted in hypotension.  Upon arrival, FHR 140s by FSE.  IUPC placed.  At that time, fetal bradycardia noted down to the 50s.  BP was dropping to 90s/40s.  RN spoke with anesthesiologist and gave ephedrine as ordered.  Amnioinfusion ordered: 300 bolus, 150 ml/hr.  At the time of this noteFHR 120s, decreased variability but no decelerations x 10 minutes.  Category III tracing, likely due to hypotension that is responding well to pressors.  Monitor closely.

## 2020-01-09 NOTE — Progress Notes (Signed)
Pt states her pain in the hotspot is decreasing after 2nd redose.  BP 119/72   Pulse (!) 117   Temp 98.3 F (36.8 C) (Oral)   Resp 18   Ht 5\' 4"  (1.626 m)   Wt 88.2 kg   LMP 01/11/2019   SpO2 99%   BMI 33.39 kg/m    Gen:  NAD Dilation: 4.5 Effacement (%): 70 Station: -2 Presentation: Vertex Exam by:: Simona Huh MD    Edematous Light Meconium per RN. IUPC and FSE in place  120s, moderate variablity, accelerations present, variable and early decelerations. Contractions q 4 minutes MVU 130  A/P IUP @ 40 3/7 weeks Category II tracing-overall reassuring   S/p Fluid bolus, position change, pressors for Hypotension, Amnioinfusion (continuous).  Start Pitocin to increase MVUs, 1 x 1 mUs.  If not tolerated by fetus, proceed with cesarean section.

## 2020-01-09 NOTE — Anesthesia Preprocedure Evaluation (Signed)
Anesthesia Evaluation  Patient identified by MRN, date of birth, ID band Patient awake    Reviewed: Allergy & Precautions, H&P , Patient's Chart, lab work & pertinent test results  Airway Mallampati: I  TM Distance: >3 FB Neck ROM: full    Dental no notable dental hx. (+) Teeth Intact   Pulmonary neg pulmonary ROS,    Pulmonary exam normal breath sounds clear to auscultation       Cardiovascular negative cardio ROS Normal cardiovascular exam Rhythm:regular Rate:Normal     Neuro/Psych negative neurological ROS  negative psych ROS   GI/Hepatic Neg liver ROS, GERD  Medicated,  Endo/Other  negative endocrine ROS  Renal/GU negative Renal ROS  negative genitourinary   Musculoskeletal negative musculoskeletal ROS (+)   Abdominal (+) + obese,   Peds  Hematology negative hematology ROS (+)   Anesthesia Other Findings   Reproductive/Obstetrics (+) Pregnancy                             Anesthesia Physical Anesthesia Plan  ASA: II  Anesthesia Plan: Epidural   Post-op Pain Management:    Induction:   PONV Risk Score and Plan:   Airway Management Planned:   Additional Equipment:   Intra-op Plan:   Post-operative Plan:   Informed Consent: I have reviewed the patients History and Physical, chart, labs and discussed the procedure including the risks, benefits and alternatives for the proposed anesthesia with the patient or authorized representative who has indicated his/her understanding and acceptance.       Plan Discussed with:   Anesthesia Plan Comments:         Anesthesia Quick Evaluation

## 2020-01-09 NOTE — Progress Notes (Addendum)
Pamela Simpson is a 39 y.o. G1P0 at [redacted]w[redacted]d admitted for induction of labor due to [facility].  Subjective: Pt with c/o increasing discomfort with contractions.  Objective: BP 137/78   Pulse 97   Temp 98.3 F (36.8 C) (Oral)   Resp 16   Ht 5\' 4"  (1.626 m)   Wt 88.2 kg   LMP 01/11/2019   BMI 33.39 kg/m  No intake/output data recorded. No intake/output data recorded.  FHT:  140s, moderate variability, Variable and occasional late decelerations UC:   q 1-2 minutes  SVE:   Dilation: 1 Effacement (%): 30 Station: -3 Exam by:: Shepard Keltz MD      Labs: Lab Results  Component Value Date   WBC 8.4 01/09/2020   HGB 11.9 (L) 01/09/2020   HCT 36.0 01/09/2020   MCV 77.9 (L) 01/09/2020   PLT 385 01/09/2020    Assessment / Plan: 39 y.o. G1P0 [redacted]w[redacted]d IOL for AMA nearing postdates [place] trait Fibroids-of no concern for delivery H/o Sleep apnea-resolved  Labor: Starting early labor.  Concerns for hyperstimulation.  Consider terbutaline if contractions do not space out.  Foley bulb deferred at this time. Fetal Wellbeing:  Category II but overall reassuring.  Fetus appears to have good reserve. Pain Control:  planning epidural.  Reviewed options. I/D:  GBS negative Anticipated MOD:  NSVD     Thurnell Lose MSN, CNM 01/09/2020, 8:16 AM

## 2020-01-09 NOTE — Progress Notes (Signed)
Pamela Simpson is a 39 y.o. G1P0 at [redacted]w[redacted]d admitted for induction of labor due to [facility].  Subjective: Discussed plan for cervical ripening. Pt agrees.   Objective: BP 128/63 (BP Location: Right Arm)   Pulse 98   Temp 98.9 F (37.2 C) (Oral)   Resp 18   Ht 5\' 4"  (1.626 m)   Wt 88.2 kg   LMP 01/11/2019   BMI 33.39 kg/m  No intake/output data recorded. No intake/output data recorded.  FHT:  FHR: 140 bpm, variability: moderate,  accelerations:  Present,  decelerations:  Absent UC:   5-8 min SVE:   Dilation: Closed Effacement (%): 50 Station: -3 Exam by:: Daneil Dolin CNM  Labs: Lab Results  Component Value Date   WBC 5.0 01/22/2019   HGB 12.3 01/22/2019   HCT 36.8 01/22/2019   MCV 79.5 01/22/2019   PLT 402.0 (H) 01/22/2019    Assessment / Plan: 39 y.o. G1P0 [redacted]w[redacted]d IOL for AMA nearing postdates [place] trait Fibroids-of no concern for delivery H/o Sleep apnea-resolved  Labor: Cytotec placed by CNM Fetal Wellbeing:  Category I Pain Control:  planning epidural I/D:  GBS negative Anticipated MOD:  NSVD   Drs. Pinn and [place] aware of admission and plan of care.   Arrie Eastern MSN, CNM 01/09/2020, 12:48 AM

## 2020-01-10 ENCOUNTER — Encounter (HOSPITAL_COMMUNITY)
Admission: AD | Disposition: A | Discharge status: Home / Self Care | Payer: Self-pay | Source: Home / Self Care | Attending: Obstetrics and Gynecology

## 2020-01-10 ENCOUNTER — Encounter (HOSPITAL_COMMUNITY): Payer: Self-pay | Admitting: Obstetrics and Gynecology

## 2020-01-10 SURGERY — Surgical Case
Anesthesia: Epidural

## 2020-01-10 MED ORDER — TETANUS-DIPHTH-ACELL PERTUSSIS 5-2.5-18.5 LF-MCG/0.5 IM SUSP: 0.5000 mL | Freq: Once | INTRAMUSCULAR | Status: DC

## 2020-01-10 MED ORDER — OXYCODONE HCL 5 MG PO TABS: 5.0000 mg | ORAL_TABLET | Freq: Once | ORAL | Status: DC | PRN

## 2020-01-10 MED ORDER — OXYCODONE HCL 5 MG PO TABS: 5.0000 mg | ORAL_TABLET | ORAL | Status: DC | PRN

## 2020-01-10 MED ORDER — NALOXONE HCL 0.4 MG/ML IJ SOLN: 0.4000 mg | INTRAMUSCULAR | Status: DC | PRN

## 2020-01-10 MED ORDER — OXYTOCIN-SODIUM CHLORIDE 30-0.9 UT/500ML-% IV SOLN
INTRAVENOUS | Status: AC
  Filled 2020-01-10: qty 500

## 2020-01-10 MED ORDER — LACTATED RINGERS IV SOLN
INTRAVENOUS | Status: DC
  Filled 2020-01-10 (×3): qty 1000

## 2020-01-10 MED ORDER — PHENYLEPHRINE 40 MCG/ML (10ML) SYRINGE FOR IV PUSH (FOR BLOOD PRESSURE SUPPORT)
PREFILLED_SYRINGE | INTRAVENOUS | Status: AC
  Filled 2020-01-10: qty 10

## 2020-01-10 MED ORDER — KETOROLAC TROMETHAMINE 30 MG/ML IJ SOLN: 30.0000 mg | Freq: Four times a day (QID) | INTRAMUSCULAR | Status: AC | PRN

## 2020-01-10 MED ORDER — NALOXONE HCL 4 MG/10ML IJ SOLN
1.0000 ug/kg/h | INTRAVENOUS | Status: DC | PRN
  Filled 2020-01-10: qty 5

## 2020-01-10 MED ORDER — OXYTOCIN-SODIUM CHLORIDE 30-0.9 UT/500ML-% IV SOLN
2.5000 [IU]/h | INTRAVENOUS | Status: AC
  Administered 2020-01-10: 2.5 [IU]/h via INTRAVENOUS
  Filled 2020-01-10: qty 500

## 2020-01-10 MED ORDER — KETOROLAC TROMETHAMINE 30 MG/ML IJ SOLN
INTRAMUSCULAR | Status: AC
  Filled 2020-01-10: qty 1

## 2020-01-10 MED ORDER — OXYTOCIN-SODIUM CHLORIDE 30-0.9 UT/500ML-% IV SOLN
INTRAVENOUS | Status: DC | PRN
  Administered 2020-01-10: 400 mL via INTRAVENOUS

## 2020-01-10 MED ORDER — DIPHENHYDRAMINE HCL 25 MG PO CAPS
25.0000 mg | ORAL_CAPSULE | Freq: Four times a day (QID) | ORAL | Status: DC | PRN
  Administered 2020-01-10: 25 mg via ORAL

## 2020-01-10 MED ORDER — PHENYLEPHRINE HCL (PRESSORS) 10 MG/ML IV SOLN
INTRAVENOUS | Status: DC | PRN
  Administered 2020-01-10: 40 ug via INTRAVENOUS
  Administered 2020-01-10 (×2): 80 ug via INTRAVENOUS
  Administered 2020-01-10: 40 ug via INTRAVENOUS
  Administered 2020-01-10: 80 ug via INTRAVENOUS

## 2020-01-10 MED ORDER — FENTANYL CITRATE (PF) 100 MCG/2ML IJ SOLN: 50.0000 ug | INTRAMUSCULAR | Status: DC | PRN

## 2020-01-10 MED ORDER — NALBUPHINE HCL 10 MG/ML IJ SOLN: 5.0000 mg | INTRAMUSCULAR | Status: DC | PRN

## 2020-01-10 MED ORDER — ZOLPIDEM TARTRATE 5 MG PO TABS: 5.0000 mg | ORAL_TABLET | Freq: Every evening | ORAL | Status: DC | PRN

## 2020-01-10 MED ORDER — MENTHOL 3 MG MT LOZG: 1.0000 | LOZENGE | OROMUCOSAL | Status: DC | PRN

## 2020-01-10 MED ORDER — WITCH HAZEL-GLYCERIN EX PADS
1.0000 "application " | MEDICATED_PAD | CUTANEOUS | Status: DC | PRN
  Filled 2020-01-10: qty 100

## 2020-01-10 MED ORDER — ONDANSETRON HCL 4 MG/2ML IJ SOLN
INTRAMUSCULAR | Status: DC | PRN
  Administered 2020-01-10: 4 mg via INTRAVENOUS

## 2020-01-10 MED ORDER — ONDANSETRON HCL 4 MG/2ML IJ SOLN: 4.0000 mg | Freq: Once | INTRAMUSCULAR | Status: DC | PRN

## 2020-01-10 MED ORDER — DIBUCAINE (PERIANAL) 1 % EX OINT: 1.0000 "application " | TOPICAL_OINTMENT | CUTANEOUS | Status: DC | PRN

## 2020-01-10 MED ORDER — MEPERIDINE HCL 25 MG/ML IJ SOLN: 6.2500 mg | INTRAMUSCULAR | Status: DC | PRN

## 2020-01-10 MED ORDER — COCONUT OIL OIL: 1.0000 "application " | TOPICAL_OIL | Status: DC | PRN

## 2020-01-10 MED ORDER — MORPHINE SULFATE (PF) 0.5 MG/ML IJ SOLN
INTRAMUSCULAR | Status: AC
  Filled 2020-01-10: qty 10

## 2020-01-10 MED ORDER — NALBUPHINE HCL 10 MG/ML IJ SOLN: 5.0000 mg | Freq: Once | INTRAMUSCULAR | Status: DC | PRN

## 2020-01-10 MED ORDER — PRENATAL MULTIVITAMIN CH
1.0000 | ORAL_TABLET | Freq: Every day | ORAL | Status: DC
  Administered 2020-01-10 – 2020-01-13 (×4): 1 via ORAL
  Filled 2020-01-10 (×6): qty 1

## 2020-01-10 MED ORDER — SCOPOLAMINE 1 MG/3DAYS TD PT72: 1.0000 | MEDICATED_PATCH | Freq: Once | TRANSDERMAL | Status: DC

## 2020-01-10 MED ORDER — SENNOSIDES-DOCUSATE SODIUM 8.6-50 MG PO TABS
2.0000 | ORAL_TABLET | ORAL | Status: DC
  Administered 2020-01-10 – 2020-01-12 (×3): 2 via ORAL
  Filled 2020-01-10 (×3): qty 2

## 2020-01-10 MED ORDER — DIPHENHYDRAMINE HCL 25 MG PO CAPS
25.0000 mg | ORAL_CAPSULE | ORAL | Status: DC | PRN
  Filled 2020-01-10: qty 1

## 2020-01-10 MED ORDER — FERROUS SULFATE 325 (65 FE) MG PO TABS
325.0000 mg | ORAL_TABLET | Freq: Two times a day (BID) | ORAL | Status: DC
  Administered 2020-01-11 – 2020-01-13 (×4): 325 mg via ORAL
  Filled 2020-01-10 (×6): qty 1

## 2020-01-10 MED ORDER — SIMETHICONE 80 MG PO CHEW
80.0000 mg | CHEWABLE_TABLET | ORAL | Status: DC
  Administered 2020-01-10 – 2020-01-12 (×3): 80 mg via ORAL
  Filled 2020-01-10 (×3): qty 1

## 2020-01-10 MED ORDER — LIDOCAINE HCL (PF) 2 % IJ SOLN
INTRAMUSCULAR | Status: AC
  Filled 2020-01-10: qty 5

## 2020-01-10 MED ORDER — METHYLERGONOVINE MALEATE 0.2 MG/ML IJ SOLN: 0.2000 mg | INTRAMUSCULAR | Status: DC | PRN

## 2020-01-10 MED ORDER — DIPHENHYDRAMINE HCL 50 MG/ML IJ SOLN: 12.5000 mg | INTRAMUSCULAR | Status: DC | PRN

## 2020-01-10 MED ORDER — IBUPROFEN 800 MG PO TABS
800.0000 mg | ORAL_TABLET | Freq: Three times a day (TID) | ORAL | Status: AC
  Administered 2020-01-10 – 2020-01-13 (×9): 800 mg via ORAL
  Filled 2020-01-10 (×9): qty 1

## 2020-01-10 MED ORDER — ONDANSETRON HCL 4 MG/2ML IJ SOLN: 4.0000 mg | Freq: Three times a day (TID) | INTRAMUSCULAR | Status: DC | PRN

## 2020-01-10 MED ORDER — LACTATED RINGERS IV SOLN: INTRAVENOUS | Status: DC | PRN

## 2020-01-10 MED ORDER — SIMETHICONE 80 MG PO CHEW: 80.0000 mg | CHEWABLE_TABLET | ORAL | Status: DC | PRN

## 2020-01-10 MED ORDER — FENTANYL CITRATE (PF) 100 MCG/2ML IJ SOLN
INTRAMUSCULAR | Status: DC | PRN
Start: 2020-01-10 — End: 2020-01-10
  Administered 2020-01-10: 100 ug via EPIDURAL

## 2020-01-10 MED ORDER — SIMETHICONE 80 MG PO CHEW
80.0000 mg | CHEWABLE_TABLET | Freq: Three times a day (TID) | ORAL | Status: DC
  Administered 2020-01-10 – 2020-01-13 (×9): 80 mg via ORAL
  Filled 2020-01-10 (×9): qty 1

## 2020-01-10 MED ORDER — KETOROLAC TROMETHAMINE 30 MG/ML IJ SOLN
30.0000 mg | Freq: Four times a day (QID) | INTRAMUSCULAR | Status: AC | PRN
  Administered 2020-01-10: 30 mg via INTRAMUSCULAR

## 2020-01-10 MED ORDER — SODIUM CHLORIDE 0.9 % IR SOLN
Status: DC | PRN
  Administered 2020-01-10: 1000 mL

## 2020-01-10 MED ORDER — CEFAZOLIN SODIUM-DEXTROSE 2-3 GM-%(50ML) IV SOLR
INTRAVENOUS | Status: DC | PRN
  Administered 2020-01-10: 2 g via INTRAVENOUS

## 2020-01-10 MED ORDER — ONDANSETRON HCL 4 MG/2ML IJ SOLN
INTRAMUSCULAR | Status: AC
  Filled 2020-01-10: qty 2

## 2020-01-10 MED ORDER — ACETAMINOPHEN 500 MG PO TABS
1000.0000 mg | ORAL_TABLET | Freq: Four times a day (QID) | ORAL | Status: DC
  Administered 2020-01-10 – 2020-01-13 (×13): 1000 mg via ORAL
  Filled 2020-01-10 (×13): qty 2

## 2020-01-10 MED ORDER — FENTANYL CITRATE (PF) 100 MCG/2ML IJ SOLN
INTRAMUSCULAR | Status: AC
  Filled 2020-01-10: qty 2

## 2020-01-10 MED ORDER — OXYCODONE HCL 5 MG/5ML PO SOLN: 5.0000 mg | Freq: Once | ORAL | Status: DC | PRN

## 2020-01-10 MED ORDER — FENTANYL CITRATE (PF) 100 MCG/2ML IJ SOLN: 25.0000 ug | INTRAMUSCULAR | Status: DC | PRN

## 2020-01-10 MED ORDER — METHYLERGONOVINE MALEATE 0.2 MG PO TABS
0.2000 mg | ORAL_TABLET | ORAL | Status: DC | PRN
  Filled 2020-01-10: qty 1

## 2020-01-10 MED ORDER — SODIUM CHLORIDE 0.9% FLUSH: 3.0000 mL | INTRAVENOUS | Status: DC | PRN

## 2020-01-10 MED ORDER — MORPHINE SULFATE (PF) 0.5 MG/ML IJ SOLN
INTRAMUSCULAR | Status: DC | PRN
Start: 2020-01-10 — End: 2020-01-10
  Administered 2020-01-10: 3 mg via EPIDURAL

## 2020-01-10 SURGICAL SUPPLY — 36 items
BARRIER ADHS 3X4 INTERCEED (GAUZE/BANDAGES/DRESSINGS) ×2 IMPLANT
BENZOIN TINCTURE PRP APPL 2/3 (GAUZE/BANDAGES/DRESSINGS) ×2 IMPLANT
CHLORAPREP W/TINT 26ML (MISCELLANEOUS) ×2 IMPLANT
CLAMP CORD UMBIL (MISCELLANEOUS) IMPLANT
CLOTH BEACON ORANGE TIMEOUT ST (SAFETY) ×2 IMPLANT
CLSR STERI-STRIP ANTIMIC 1/2X4 (GAUZE/BANDAGES/DRESSINGS) ×2 IMPLANT
DERMABOND ADVANCED (GAUZE/BANDAGES/DRESSINGS)
DERMABOND ADVANCED .7 DNX12 (GAUZE/BANDAGES/DRESSINGS) IMPLANT
DRSG OPSITE POSTOP 4X10 (GAUZE/BANDAGES/DRESSINGS) ×2 IMPLANT
ELECT REM PT RETURN 9FT ADLT (ELECTROSURGICAL) ×2
ELECTRODE REM PT RTRN 9FT ADLT (ELECTROSURGICAL) ×1 IMPLANT
EXTRACTOR VACUUM BELL STYLE (SUCTIONS) IMPLANT
GLOVE BIO SURGEON STRL SZ7 (GLOVE) ×2 IMPLANT
GLOVE BIOGEL PI IND STRL 7.0 (GLOVE) ×2 IMPLANT
GLOVE BIOGEL PI INDICATOR 7.0 (GLOVE) ×2
GOWN STRL REUS W/TWL LRG LVL3 (GOWN DISPOSABLE) ×4 IMPLANT
KIT ABG SYR 3ML LUER SLIP (SYRINGE) IMPLANT
NEEDLE HYPO 25X5/8 SAFETYGLIDE (NEEDLE) IMPLANT
NS IRRIG 1000ML POUR BTL (IV SOLUTION) ×2 IMPLANT
PACK C SECTION WH (CUSTOM PROCEDURE TRAY) ×2 IMPLANT
PAD OB MATERNITY 4.3X12.25 (PERSONAL CARE ITEMS) ×2 IMPLANT
PENCIL SMOKE EVAC W/HOLSTER (ELECTROSURGICAL) ×2 IMPLANT
RTRCTR C-SECT PINK 25CM LRG (MISCELLANEOUS) ×2 IMPLANT
STRIP CLOSURE SKIN 1/2X4 (GAUZE/BANDAGES/DRESSINGS) IMPLANT
SUT CHROMIC 0 CTX 36 (SUTURE) IMPLANT
SUT MON AB 4-0 PS1 27 (SUTURE) ×2 IMPLANT
SUT PLAIN 0 NONE (SUTURE) IMPLANT
SUT PLAIN 2 0 XLH (SUTURE) ×2 IMPLANT
SUT VIC AB 0 CTX 36 (SUTURE) ×5
SUT VIC AB 0 CTX36XBRD ANBCTRL (SUTURE) ×5 IMPLANT
SUT VIC AB 2-0 CT1 27 (SUTURE) ×1
SUT VIC AB 2-0 CT1 TAPERPNT 27 (SUTURE) ×1 IMPLANT
TOWEL OR 17X24 6PK STRL BLUE (TOWEL DISPOSABLE) ×2 IMPLANT
TRAY FOLEY W/BAG SLVR 14FR LF (SET/KITS/TRAYS/PACK) IMPLANT
WATER STERILE IRR 1000ML POUR (IV SOLUTION) ×2 IMPLANT
YANKAUER SUCT BULB TIP NO VENT (SUCTIONS) ×2 IMPLANT

## 2020-01-10 NOTE — Progress Notes (Addendum)
Delayed entry  Pt comfortable.  Per RN, baby has decelerations when on the left side.  Good return on the amnioinfusion.   Dilation: 5.5 Effacement (%): 90 Cervical Position: Middle Station: -1 Presentation: Vertex Exam by:: Dr. Simona Huh  Anterior lip is edematous, caput noted. Suspect OP presentation.  Category I tracing at the time of the exam. Inadequate contractions.  Limited in position changes to encourage rotation.  Pt has been was 4 cm 14 hours ago, now with minimal progression. Recommend primary cesarean section due to failure to progress.  Reviewed risks, benefits and alternatives.  All questions answered.

## 2020-01-10 NOTE — Anesthesia Postprocedure Evaluation (Signed)
Anesthesia Post Note  Patient: Pamela Simpson  Procedure(s) Performed: CESAREAN SECTION (N/A )     Anesthesia Type: Epidural Level of consciousness: oriented and awake and alert Pain management: pain level controlled Vital Signs Assessment: post-procedure vital signs reviewed and stable Respiratory status: spontaneous breathing, respiratory function stable and nonlabored ventilation Cardiovascular status: blood pressure returned to baseline and stable Postop Assessment: no headache, no backache, no apparent nausea or vomiting and epidural receding Anesthetic complications: no   No complications documented.  Last Vitals:  Vitals:   01/10/20 1430 01/10/20 1700  BP:    Pulse:    Resp: 18 18  Temp:  36.5 C  SpO2: 99% 100%    Last Pain:  Vitals:   01/10/20 1700  TempSrc: Oral  PainSc:    Pain Goal:                   [name]

## 2020-01-10 NOTE — Transfer of Care (Signed)
Immediate Anesthesia Transfer of Care Note  Patient: Tobin Chad  Procedure(s) Performed: CESAREAN SECTION (N/A )  Patient Location: PACU  Anesthesia Type:Epidural  Level of Consciousness: awake, alert  and oriented  Airway & Oxygen Therapy: Patient Spontanous Breathing  Post-op Assessment: Report given to RN and Post -op Vital signs reviewed and stable  Post vital signs: Reviewed and stable  Last Vitals:  Vitals Value Taken Time  BP    Temp    Pulse 106 01/10/20 0721  Resp 26 01/10/20 0721  SpO2 100 % 01/10/20 0721  Vitals shown include unvalidated device data.  Last Pain:  Vitals:   01/10/20 0719  TempSrc:   PainSc: (P) 0-No pain         Complications: No complications documented.

## 2020-01-10 NOTE — Lactation Note (Signed)
This note was copied from a baby's chart. Lactation Consultation Note  Patient Name: Pamela Simpson [id number] Date: 01/10/2020 Reason for consult: Initial assessment;Primapara;1st time breastfeeding;Term;Other (Comment) (blood sugars - 29-53 -72 - mom aware to call next feeding ( recently fed with domor milk ))  Baby is 67 hours old . Per mom last fed 1:30 pm ( attempted ) and then donor milk due to low blood sugar.  MBURN has shown mom hand expressing and has received 3 ml and 1 ml.  Mom aware to call with feeding cues for [facility] for feeding assessment.  LC provided the [facility] pamphlet with phone numbers.   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   Baby had a black smear and mom requested to change baby and did well.    Maternal Data Does the patient have breastfeeding experience prior to this delivery?: No  Feeding Feeding Type: Donor Breast Milk  LATCH Score                   Interventions Interventions: Breast feeding basics reviewed  Lactation Tools Discussed/Used     Consult Status Consult Status: Follow-up Date: 01/10/20 Follow-up type: In-patient    [place] 01/10/2020, 3:14 PM

## 2020-01-10 NOTE — Lactation Note (Signed)
This note was copied from a baby's chart. Lactation Consultation Note  Patient Name: Pamela Simpson [id number] Date: 01/10/2020 Reason for consult: Follow-up assessment;Term;Primapara;1st time breastfeeding (Mom had been spoon feeding/  using a 5 french having trouble, infant very spitty with feeds.)   Infant is 39 weeks 27 hours old. Mom had been using a 5 french with finger feeding and spoon feeding DBM. Infant is being followed for low glucose. Mom stated she felt the infant was having trouble with the volume with spoon feeding and had trouble with the 5 french. Infant had been very spitty and she used the bulb syringe a few times. Dad stated infant only able to take 2 ml at the last feeding prior to [facility] visit.   When [place] arrived infant was in Mom's arms. LC assisted Mom in the bed and started breast massage and hand expression from both breasts. LC able to collect 6 ml of EBM. Mom's nipples everted and compressible, they tend to invert with touch. LC used a 24 NS and latched the baby in football on the R side. Following suck training, infant was able to sustain the latch with the NS primed with EBM. Infant able to take 6 ml using a curve tip syringe to prime the NS, and consistently nursed for 20 minutes. With repeated support, notable swallows and signs of milk transfer were observed.   Mom started on a DEBP to help increase milk volume. Mom still pumping at the end of the visit.   Education provided on feeding cues 8-12 x in 24 hrs, following by pumping with DEBP. Mom was comfortable with using the nipple shield primed with EBM with each feeding but will also attempt to latch the baby at the breast.  Plan: 1. Mom follow breastfeeding supplementation guideline based on infants age and hours of life.            2. LC support brochure provided and reviewed.            3. 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            4. Mom knows to call [facility] or nurse for further assistance.    Maternal Data Has patient been taught Hand Expression?: Yes Does the patient have breastfeeding experience prior to this delivery?: No  Feeding Feeding Type: Breast Fed  LATCH Score Latch: Repeated attempts needed to sustain latch, nipple held in mouth throughout feeding, stimulation needed to elicit sucking reflex.  Audible Swallowing: A few with stimulation  Type of Nipple: Flat (Mom nipples are everted but invert with stimulation)  Comfort (Breast/Nipple): Soft / non-tender  Hold (Positioning): Assistance needed to correctly position infant at breast and maintain latch.  LATCH Score: 6  Interventions Interventions: Breast feeding basics reviewed;Assisted with latch;Position options;Expressed milk;Skin to skin;Breast massage;Hand express;Hand pump;DEBP;Support pillows;Adjust position  Lactation Tools Discussed/Used Tools: Pump;Flanges;Nipple Shields Nipple shield size: 24 Flange Size: 27 Breast pump type: Double-Electric Breast Pump;Manual Pump Review: Setup, frequency, and cleaning;Milk Storage Initiated by:: Frederica Kuster Date initiated:: 01/10/20   Consult Status Consult Status: Follow-up Date: 01/11/20 Follow-up type: In-patient    Tanaya Dunigan  Nicholson-Springer 01/10/2020, 8:43 PM

## 2020-01-10 NOTE — Brief Op Note (Signed)
01/10/2020  7:13 AM  PATIENT:  Pamela Simpson  39 y.o. female  PRE-OPERATIVE DIAGNOSIS:  IUP @ 40 4/7 weeks; failure to progress, AMA  POST-OPERATIVE DIAGNOSIS:  Same  PROCEDURE:  Procedure(s) with comments: CESAREAN SECTION (N/A) - Failure to progress Primary, LTCS  SURGEON:  Surgeon(s) and Role:    Thurnell Lose, MD - Primary  PHYSICIAN ASSISTANT:   ASSISTANTS: Haskel Khan, CNM, Technician   ANESTHESIA:   epidural  EBL:  192 ml   BLOOD ADMINISTERED:none  DRAINS: Urinary Catheter (Foley)   LOCAL MEDICATIONS USED:  NONE  SPECIMEN:  Source of Specimen:  Placenta  DISPOSITION OF SPECIMEN:  PATHOLOGY  COUNTS:  YES  TOURNIQUET:  * No tourniquets in log *  DICTATION: .Other Dictation: Dictation Number [id number] PLAN OF CARE: Transfer to postpartum after PACU  PATIENT DISPOSITION:  PACU - hemodynamically stable.   Delay start of Pharmacological VTE agent (>24hrs) due to surgical blood loss or risk of bleeding: yes

## 2020-01-10 NOTE — Progress Notes (Signed)
Cesarean section delayed.  Two other c-sections called first.  Baby is reassuring. In to reassess cervix prior to rolling back. Cervix unchanged. Anterior lip still quite edematous, caput noted. Pt feels warm.  Temp 99.7. Overall category I tracing. Occasional variable noted. Change of baseline form 150 to 120 when pt supine.   Anticipate pt will be called to OR soon.

## 2020-01-11 LAB — CBC
HCT: 29.6 % — ABNORMAL LOW (ref 36.0–46.0)
Hemoglobin: 10.1 g/dL — ABNORMAL LOW (ref 12.0–15.0)
MCH: 26.4 pg (ref 26.0–34.0)
MCHC: 34.1 g/dL (ref 30.0–36.0)
MCV: 77.3 fL — ABNORMAL LOW (ref 80.0–100.0)
Platelets: 319 10*3/uL (ref 150–400)
RBC: 3.83 MIL/uL — ABNORMAL LOW (ref 3.87–5.11)
RDW: 16 % — ABNORMAL HIGH (ref 11.5–15.5)
WBC: 18.8 10*3/uL — ABNORMAL HIGH (ref 4.0–10.5)
nRBC: 0 % (ref 0.0–0.2)

## 2020-01-11 LAB — RPR: RPR Ser Ql: NONREACTIVE — AB

## 2020-01-11 NOTE — Lactation Note (Addendum)
This note was copied from a baby's chart. Lactation Consultation Note  Patient Name: Pamela Simpson [id number] Date: 01/11/2020 Reason for consult: Mother's request P1, 94 term female infant. Per mom, she did not use 24 mm NS at last feeding.  Infant had 2 voids while LC was in the room. Mom BF infant sitting in chair LC assisted mom in removing clothing and BF [name] STS.  Infant doesn't need speech consult he latched well at breast with audible swallows using a 5 french feeding system.  LC ask mom to do breast stimulation help evert flat nipple out more, infant was supplemented 7 mls of donor milk at breast using a 5 french feeding system. Per dad,  Infant BF  20 minutes prior to [facility] entering room but was sleepy, on and off breast probably due to mom BF infant swaddled in clothing and blankets.  With [place] assistance infant BF an additional  for 20 minutes  on mom's left breast sustaining the latch throughout the whole feeding in  the football hold position STS, using a  5 french feeding system , infant received 7 mls of donor breast milk while feeding at the breast. Infant latch has improved, he is sustaining latch and not longer falling asleep at the breast. Mom has 7 mls of donor breast milk and 10 mls of EBM in the fridge,  mom will  Offer  throughout the night at next feedings using a  5 french feeding system to supplement infant. Infant can be  re-evaluated  in morning and Mom will attempt to latch infant at  breast without using the  NS or using  a 5 french system. Per mom, when she last used the DEBP she pumped 10 mls of EBM,  Mom is tired and needs rest, going forward LC suggested mom only pump  6 times within 24 hour period mom is agreeable with this plan.   Maternal Data    Feeding Feeding Type: Breast Fed Nipple Type: Slow - flow  LATCH Score Latch: Grasps breast easily, tongue down, lips flanged, rhythmical sucking.  Audible Swallowing: Spontaneous and intermittent  Type of  Nipple: Flat  Comfort (Breast/Nipple): Soft / non-tender  Hold (Positioning): Assistance needed to correctly position infant at breast and maintain latch.  LATCH Score: 8  Interventions Interventions: Skin to skin;Adjust position;Breast compression;Position options;Support pillows;DEBP  Lactation Tools Discussed/Used Tools: Bottle Nipple shield size: 24   Consult Status Consult Status: Follow-up Date: 01/12/20 Follow-up type: In-patient    Vicente Serene 01/11/2020, 10:02 PM

## 2020-01-11 NOTE — Op Note (Signed)
Pamela Simpson, Pamela Simpson MEDICAL RECORD [id number] ACCOUNT [id number] DATE OF BIRTH:[birth year] FACILITY: [facility] LOCATION: [facility] PHYSICIAN:Wayne Brunker Al Decant, MD  OPERATIVE REPORT  DATE OF PROCEDURE:  01/10/2020  PREOPERATIVE DIAGNOSES:  Intrauterine pregnancy at 40 and 4 weeks, failure to progress, and AMA.  POSTOPERATIVE DIAGNOSES:  Intrauterine pregnancy at 3 and 4 weeks, failure to progress and, AMA, occiput transverse position.  PROCEDURE:  Primary low transverse cesarean section with 2-layer closure.  SURGEON:  Thurnell Lose, MD  ASSISTANT:  Derrell Lolling certified nurse midwife and technician.  ANESTHESIA:  Epidural.  ESTIMATED BLOOD LOSS:  192 mL.  DRAINS:  Foley.  SPECIMEN:  Placenta.  DISPOSITION OF SPECIMEN:  To pathology.  COUNTS CORRECT Yes.  PATIENT DISPOSITION:  To PACU, hemodynamically stable.  FINDINGS:  Viable female infant in the direct OT position.  Normal uterus, fallopian tubes and ovaries bilaterally, folded cord on the patient's left.  The cord was folded on the left side of the uterus adjacent to the torso and shoulders of the baby.   Apgars 9 and 9.  Fetal weight 8 pounds 5 ounces.  INDICATIONS:  The patient came in for induction at 40 and 2/7 weeks.  Her labor course was complicated by fetal distress, suspected due to hypotension, but also some positional issues.  She could not be rotated on her left side.  She was started on  Pitocin and over approximately from the time she was 4 cm until the time of cesarean section was called, it had been 14 hours, and she had only progressed to 5.5 cm and it was difficult to get the contractions adequate.  At the time the cesarean section  was called, she had a very edematous anterior lip of her cervix and there was significant caput or molding noted.  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  DESCRIPTION OF PROCEDURE:  The patient was taken to the operating room.  Epidural  anesthesia was optimized.  Fetal status was reassuring intraoperatively.  She was prepped in a normal fashion.  Ancef 2 grams IV was administered.  SCDs were on her legs  and operating.  A timeout was performed.  She was then draped and adequate anesthesia was confirmed.  A Pfannenstiel incision was marked 2 cm above the symphysis pubis.  The skin was incised with the scalpel, carried down to the underlying layer of the fascia with the Bovie.  The fascia was incised at the midline.  A fascial incision was extended laterally with the curved Mayo scissors.  The rectus muscles were dissected  sharply off of the fascia with the curved Mayo scissors.  The rectus muscles were easily separated at the midline.  The peritoneum was identified.  Due to suspected CPD, I went high on the peritoneum as to avoid any bladder injury.  It was entered  sharply.  Clear fluid noted.  Peritoneum was stretched.  We were well away from the bladder.  Uterus was palpated.  Alexis retractor was placed.  Attention was turned to the lower uterine segment of the uterus.  The serosa of the uterus was then incised with the Metzenbaum scissors and extended laterally.  The bladder flap was easily distally placed.  A transverse incision was made with the  scalpel and extended with the bandage scissors.  Upon entering the uterus, moderate meconium was noted.  Upon reaching my hand in, the baby's right ear was direct anterior, confirming an occiput transverse position.  We gently brought the head to the  hysterotomy incision; and at that time, the baby was direct OP.  Nose and mouth were suctioned.  Shoulders  were easily delivered.  Baby was suctioned on the field.  Delayed cord clamping was performed for 1 minute.  While we were waiting, the uterus was clamped to minimize blood loss.  Baby was eventually handed off to the awaiting NICU staff.  The cord was  clamped x2 and cut.  The hysterotomy incision was closed with 0 Vicryl in a  continuous locked fashion.  Same suture was used to imbricate for second layer.  There was a small area that required one figure-of-eight suture for hemostasis.  The abdomen was then copiously  irrigated in both gutters.  Interceed was then applied to the lower uterine segment after the ovaries and fallopian tubes were identified.  The peritoneum was then reapproximated with 2-0 Vicryl in a continuous fashion.  The rectus muscles and fascia were examined for bleeding.  Hemostasis was achieved with the Bovie as needed.  The fascial incision was then reapproximated with 0 Vicryl in a  continuous fashion.  The subcutaneous space was reapproximated with 2-0 plain gut in a continuous fashion and the skin was reapproximated with 4-0 Monocryl with a subcuticular stitch.  Steri-Strips and benzoin were applied.  All instrument, sponge sticks were correct x3.  The patient was taken to the operating room.  PN/NUANCE  D:01/10/2020 T:01/11/2020 JOB:[id number]

## 2020-01-11 NOTE — Lactation Note (Addendum)
This note was copied from a baby's chart. Lactation Consultation Note  Patient Name: Boy Nolita Kutter [id number] Date: 01/11/2020 Reason for consult: Follow-up assessment  Infant is 37 hours old 40 weeks. Mom states infant having difficulty with the latch. Mom has been using the NS size 24 and attempting infant at the breast. Infant pushes nipple out with and w/o NS as per Mom. Mom gave a few drops of colostrum from the breasts into the babies mouth. Mom pumped 2 x and was also able to give 2.5 ml, 5 ml EBM with subsequent feedings as well.   Mom tearful during the assessment since infant only taking in small volumes. Reassured Mom infant has adequate urine and stool and minimal weight loss at 3%. Parents concerned Mom may not have enough for feedings. Mom and Dad decided to offer infant DBM. LC went to obtain [facility] for this feeding.   LC assisted the Mom at the breast in football with and without the NS and infant tongue thrusts and has difficulty sustaining the latch for more than 5 minutes. LC then primed the NS with DBM and was able to give infant about 2 ml. Infant was alert and cueing with eyes open and engaged. LC did not want to lose more DBM using the nipple shield since infant was unable to sustain the latch and pushes off the nipple. LC switched to the yellow slow flow nipple and paced bottle fed infant about 1 ml. Infant sensitive to oral stimulation with the nipple and seemed affected by the flow. LC switched to a purple slow flow nipple and infant continued to tongue thrust.   LC stopped feeding at this time and discussed findings with other [place] on duty, Jon Gills. At this time, I will speak to the RN and put in a consult for Speech Language Pathologist to assess infant's coordination, suck and swallow as well as tongue thrusting. Infant also continues to take in small volumes of 67ml and 75ml for feeds despite changing feeding methods from the Nipple shield at the breast to Slow Flow/Extra  slow flow nipple.   Mom was in the bathroom when [facility] returned to talk to Mom. [place] talked with Mardene Sayer, RN, about the Speech Language Pathologist consult,  first since they are changing shift and then talk to the parents. LC discussed the plan with parents they are in agreement.   Feeding Feeding Type: Donor Breast Milk Nipple Type: Slow - flow/   LATCH Score Latch: Repeated attempts needed to sustain latch, nipple held in mouth throughout feeding, stimulation needed to elicit sucking reflex.  Audible Swallowing: A few with stimulation  Type of Nipple: Everted at rest and after stimulation  Comfort (Breast/Nipple): Soft / non-tender  Hold (Positioning): Assistance needed to correctly position infant at breast and maintain latch.  LATCH Score: 7  Interventions Interventions: Breast feeding basics reviewed;Assisted with latch;Position options;Skin to skin;Support pillows;Adjust position  Lactation Tools Discussed/Used Tools: Bottle Nipple shield size: 24   Consult Status Consult Status: Follow-up Date: 01/12/20 Follow-up type: In-patient    Kinlie Janice  Nicholson-Springer 01/11/2020, 7:25 PM

## 2020-01-11 NOTE — Progress Notes (Signed)
Subjective: Postop Day 1: Cesarean Delivery No complaints.  Pain controlled.  Lochia normal.  Breast feeding yes.  Ambulating well. Pt desires early discharge.  Couple does not want circumcision.  Objective: Temp:  [97.7 F (36.5 C)-98.5 F (36.9 C)] 98.1 F (36.7 C) (08/22 0416) Pulse Rate:  [81-106] 96 (08/22 0416) Resp:  [18-20] 18 (08/22 0416) BP: (106-112)/(62-74) 112/74 (08/22 0416) SpO2:  [98 %-100 %] 99 % (08/22 0416)  Physical Exam: Gen: NAD Abd:  Distended Lochia: Not visualized Uterine Fundus: firm, appropriately tender Incision: clean, dry and intact, healing well DVT Evaluation: + Edema present, no calf tenderness bilaterally   Recent Labs    01/09/20 0025 01/11/20 0352  HGB 11.9* 10.1*  HCT 36.0 29.6*    Assessment/Plan: Status post C-section-doing well postoperatively. Routine post op care. Encouraged ambulation in halls TID. Lactation support.  Consider discharge tomorrow afternoon in that pt delivered early on POD #0.    Pamela Simpson 01/11/2020, 12:02 PM

## 2020-01-12 MED ORDER — ACETAMINOPHEN 500 MG PO TABS
1000.0000 mg | ORAL_TABLET | Freq: Four times a day (QID) | ORAL | 0 refills | Status: DC
Start: 2020-01-13 — End: 2023-02-14

## 2020-01-12 MED ORDER — OXYCODONE HCL 5 MG PO TABS
5.0000 mg | ORAL_TABLET | ORAL | 0 refills | Status: DC | PRN
Start: 2020-01-12 — End: 2020-04-07

## 2020-01-12 MED ORDER — IBUPROFEN 600 MG PO TABS
600.0000 mg | ORAL_TABLET | Freq: Four times a day (QID) | ORAL | 1 refills | Status: DC | PRN
Start: 2020-01-12 — End: 2020-04-07

## 2020-01-12 MED ORDER — PANTOPRAZOLE SODIUM 40 MG PO TBEC
40.0000 mg | DELAYED_RELEASE_TABLET | Freq: Every day | ORAL | Status: DC
  Administered 2020-01-12: 40 mg via ORAL
  Filled 2020-01-12: qty 1

## 2020-01-12 NOTE — Discharge Instructions (Signed)
Cesarean Delivery, Care After This sheet gives you information about how to care for yourself after your procedure. Your health care provider may also give you more specific instructions. If you have problems or questions, contact your health care provider. What can I expect after the procedure? After the procedure, it is common to have:  A small amount of blood or clear fluid coming from the incision.  Some redness, swelling, and pain in your incision area.  Some abdominal pain and soreness.  Vaginal bleeding (lochia). Even though you did not have a vaginal delivery, you will still have vaginal bleeding and discharge.  Pelvic cramps.  Fatigue. You may have pain, swelling, and discomfort in the tissue between your vagina and your anus (perineum) if:  Your C-section was unplanned, and you were allowed to labor and push.  An incision was made in the area (episiotomy) or the tissue tore during attempted vaginal delivery. Follow these instructions at home: Incision care   Follow instructions from your health care provider about how to take care of your incision. Make sure you: ? Wash your hands with soap and water before you change your bandage (dressing). If soap and water are not available, use hand sanitizer. ? If you have a dressing, change it or remove it as told by your health care provider. ? Leave stitches (sutures), skin staples, skin glue, or adhesive strips in place. These skin closures may need to stay in place for 2 weeks or longer. If adhesive strip edges start to loosen and curl up, you may trim the loose edges. Do not remove adhesive strips completely unless your health care provider tells you to do that.  Check your incision area every day for signs of infection. Check for: ? More redness, swelling, or pain. ? More fluid or blood. ? Warmth. ? Pus or a bad smell.  Do not take baths, swim, or use a hot tub until your health care provider says it's okay. Ask your health  care provider if you can take showers.  When you cough or sneeze, hug a pillow. This helps with pain and decreases the chance of your incision opening up (dehiscing). Do this until your incision heals. Medicines  Take over-the-counter and prescription medicines only as told by your health care provider.  If you were prescribed an antibiotic medicine, take it as told by your health care provider. Do not stop taking the antibiotic even if you start to feel better.  Do not drive or use heavy machinery while taking prescription pain medicine. Lifestyle  Do not drink alcohol. This is especially important if you are breastfeeding or taking pain medicine.  Do not use any products that contain nicotine or tobacco, such as cigarettes, e-cigarettes, and chewing tobacco. If you need help quitting, ask your health care provider. Eating and drinking  Drink at least 8 eight-ounce glasses of water every day unless told not to by your health care provider. If you breastfeed, you may need to drink even more water.  Eat high-fiber foods every day. These foods may help prevent or relieve constipation. High-fiber foods include: ? Whole grain cereals and breads. ? Brown rice. ? Beans. ? Fresh fruits and vegetables. Activity   If possible, have someone help you care for your baby and help with household activities for at least a few days after you leave the hospital.  Return to your normal activities as told by your health care provider. Ask your health care provider what activities are safe for   you.  Rest as much as possible. Try to rest or take a nap while your baby is sleeping.  Do not lift anything that is heavier than 10 lbs (4.5 kg), or the limit that you were told, until your health care provider says that it is safe.  Talk with your health care provider about when you can engage in sexual activity. This may depend on your: ? Risk of infection. ? How fast you heal. ? Comfort and desire to  engage in sexual activity. General instructions  Do not use tampons or douches until your health care provider approves.  Wear loose, comfortable clothing and a supportive and well-fitting bra.  Keep your perineum clean and dry. Wipe from front to back when you use the toilet.  If you pass a blood clot, save it and call your health care provider to discuss. Do not flush blood clots down the toilet before you get instructions from your health care provider.  Keep all follow-up visits for you and your baby as told by your health care provider. This is important. Contact a health care provider if:  You have: ? A fever. ? Bad-smelling vaginal discharge. ? Pus or a bad smell coming from your incision. ? Difficulty or pain when urinating. ? A sudden increase or decrease in the frequency of your bowel movements. ? More redness, swelling, or pain around your incision. ? More fluid or blood coming from your incision. ? A rash. ? Nausea. ? Little or no interest in activities you used to enjoy. ? Questions about caring for yourself or your baby.  Your incision feels warm to the touch.  Your breasts turn red or become painful or hard.  You feel unusually sad or worried.  You vomit.  You pass a blood clot from your vagina.  You urinate more than usual.  You are dizzy or light-headed. Get help right away if:  You have: ? Pain that does not go away or get better with medicine. ? Chest pain. ? Difficulty breathing. ? Blurred vision or spots in your vision. ? Thoughts about hurting yourself or your baby. ? New pain in your abdomen or in one of your legs. ? A severe headache.  You faint.  You bleed from your vagina so much that you fill more than one sanitary pad in one hour. Bleeding should not be heavier than your heaviest period. Summary  After the procedure, it is common to have pain at your incision site, abdominal cramping, and slight bleeding from your vagina.  Check  your incision area every day for signs of infection.  Tell your health care provider about any unusual symptoms.  Keep all follow-up visits for you and your baby as told by your health care provider. This information is not intended to replace advice given to you by your health care provider. Make sure you discuss any questions you have with your health care provider. Document Revised: 11/14/2017 Document Reviewed: 11/14/2017 [employer]vier Patient Education  2020 [employer]  

## 2020-01-12 NOTE — Lactation Note (Signed)
This note was copied from a baby's chart. Lactation Consultation Note  Patient Name: Pamela Simpson FXJOI'T Date: 01/12/2020 Reason for consult: Follow-up assessment   Follow-up assessment of 65-hours baby Pamela, breastfeed exclusively with 7.04% weight lost. Mother is P1.  Baby is in basinet upon arrival. Parents shared their concern because baby is having a difficult time feeding. Mother is pumping and is collecting ~18mL per session. They were recommended to bottle fed MBM side-laying. Parents shared they have only been able to feed a couple of mL. Baby started showing hunger cues and LC suggested unwrapping and undressing him to stimulate him. Parents got bottle ready with 25 mL of MBM and this LC demonstrated paced bottle feeding. Baby took ~50mL.  Baby seemed comfortable and sucking rhythmically. Baby seems to tongue thrust the nipple after a couple of minutes. FOB attempted feeding, baby took a ~15mL and spit-up some undigested BM. Mother attempted feeding, able to feed ~61mL but continue to tongue thrust frequently. Encouraged parents to start waking up baby around two hours with unswaddling, diaper changing and/or skin to skin. Discussed giving baby time to adjust to nipple in mouth and wait for cues when he is ready to suckling with milk flow.  Parents would appreciate having some speech evaluation, they also verbalized concerns regarding baby's weight loss.  Parents are aware to call lactation as needed.      Feeding Feeding Type: Breast Fed Nipple Type: Extra Slow Flow  Interventions Interventions: Skin to skin (Paced bottle feeding MBM)   Consult Status Consult Status: Follow-up Date: 01/13/20 Follow-up type: In-patient    Pamela Simpson [name] 01/12/2020, 11:15 PM

## 2020-01-12 NOTE — Discharge Summary (Signed)
Postpartum Discharge Summary      Patient Name: Pamela Simpson DOB: 23-Jul-1980 MRN: [id number]  Date of admission: 01/09/2020 Delivery date:01/10/2020  Delivering provider: Thurnell Lose  Date of discharge: 01/12/2020  Admitting diagnosis: AMA (advanced maternal age) primigravida 59+, third trimester [O09.513] Intrauterine pregnancy: [redacted]w[redacted]d    Secondary diagnosis:  Active Problems:   AMA (advanced maternal age) primigravida 350+ third trimester  Additional problems: None    Discharge diagnosis: Term Pregnancy Delivered                                              Post partum procedures: None Augmentation: Pitocin and Cytotec Complications: None  Hospital course: Induction of Labor With Cesarean Section   39y.o. yo G1P0 at 424w1das admitted to the hospital 01/09/2020 for induction of labor. Patient had a labor course significant for hypotension after epidural, fetal decelerations. The patient went for cesarean section due to  Failure to progress, fetal intolerance to labor . Delivery details are as follows: Membrane Rupture Time/Date: 9:26 AM ,01/09/2020   Delivery Method:C-Section, Low Transverse  Details of operation can be found in separate operative Note.  Patient had an uncomplicated postpartum course. She is ambulating, tolerating a regular diet, passing flatus, and urinating well.  Patient is discharged home in stable condition on 01/21/20.      Newborn Data: Birth date:01/10/2020  Birth time:6:15 AM  Gender:Female  Living status:Living  Apgars:9 ,9  Weight:3.776 kg                                 Magnesium Sulfate received: No BMZ received: No Rhophylac:No MMR:No T-DaP:Given prenatally Flu: No Transfusion:No  Physical exam  Vitals:   01/11/20 2000 01/11/20 2024 01/12/20 0616 01/12/20 1405  BP: 120/75  120/73 123/89  Pulse: 85  95 96  Resp: '18 18 18 18  ' Temp: 98.1 F (36.7 C) 98.1 F (36.7 C) 98.3 F (36.8 C) 99.3 F (37.4 C)  TempSrc: Oral Oral  Oral Oral  SpO2:   100%   Weight:      Height:       General: alert, cooperative, and no distress Lochia: appropriate Uterine Fundus: firm Incision: Healing well with no significant drainage, Dressing is clean, dry, and intact DVT Evaluation: No evidence of DVT seen on physical exam. Calf/Ankle edema is present Labs: Lab Results  Component Value Date   WBC 18.8 (H) 01/11/2020   HGB 10.1 (L) 01/11/2020   HCT 29.6 (L) 01/11/2020   MCV 77.3 (L) 01/11/2020   PLT 319 01/11/2020   CMP Latest Ref Rng & Units 01/22/2019  Glucose 70 - 99 mg/dL 87  BUN 6 - 23 mg/dL 9  Creatinine 0.40 - 1.20 mg/dL 0.69  Sodium 135 - 145 mEq/L 138  Potassium 3.5 - 5.1 mEq/L 4.4  Chloride 96 - 112 mEq/L 101  CO2 19 - 32 mEq/L 31  Calcium 8.4 - 10.5 mg/dL 9.8  Total Protein 6.0 - 8.3 g/dL 7.2  Total Bilirubin 0.2 - 1.2 mg/dL 0.4  Alkaline Phos 39 - 117 U/L 37(L)  AST 0 - 37 U/L 11  ALT 0 - 35 U/L 11   Edinburgh Score: Edinburgh Postnatal Depression Scale Screening Tool 01/11/2020  I have been able to laugh and see the funny side of  things. 0  I have looked forward with enjoyment to things. 0  I have blamed myself unnecessarily when things went wrong. 1  I have been anxious or worried for no good reason. 0  I have felt scared or panicky for no good reason. 0  Things have been getting on top of me. 0  I have been so unhappy that I have had difficulty sleeping. 0  I have felt sad or miserable. 0  I have been so unhappy that I have been crying. 0  The thought of harming myself has occurred to me. 0  Edinburgh Postnatal Depression Scale Total 1      After visit meds:  Allergies as of 01/12/2020   No Known Allergies      Medication List     TAKE these medications    acetaminophen 500 MG tablet Commonly known as: TYLENOL Take 2 tablets (1,000 mg total) by mouth every 6 (six) hours. Start taking on: January 13, 2020   ibuprofen 600 MG tablet Commonly known as: ADVIL Take 1 tablet (600 mg  total) by mouth every 6 (six) hours as needed.   Integra 62.5-62.5-40-3 MG Caps Take 1 capsule by mouth daily.   levocetirizine 5 MG tablet Commonly known as: XYZAL Take 5 mg by mouth every evening.   oxyCODONE 5 MG immediate release tablet Commonly known as: Oxy IR/ROXICODONE Take 1 tablet (5 mg total) by mouth every 4 (four) hours as needed for severe pain or breakthrough pain.   pantoprazole 40 MG tablet Commonly known as: PROTONIX Take 40 mg by mouth daily.   prenatal multivitamin Tabs tablet Take 1 tablet by mouth daily at 12 noon.               Discharge Care Instructions  (From admission, onward)           Start     Ordered   01/12/20 0000  Discharge wound care:       Comments: Remove Honeycomb dressing on Wednesday, 01/14/20.   01/12/20 1813             Discharge home in stable condition Infant Feeding: Breast Infant Disposition:home with mother Discharge instruction: per After Visit Summary and Postpartum booklet. Activity: Advance as tolerated. Pelvic rest for 6 weeks.  Diet: routine diet Anticipated Birth Control:  Discussed Postpartum Appointment:2 weeks Additional Postpartum F/U: Incision check 2 weeks Future Appointments:No future appointments. Follow up Visit:  Follow-up Information     Drema Dallas, DO. Schedule an appointment as soon as possible for a visit in 2 week(s).   Specialty: Obstetrics and Gynecology Contact information: [place] [address] [phone]                     01/12/2020 Thurnell Lose, MD

## 2020-01-12 NOTE — Progress Notes (Signed)
Subjective: Postop Day 1: Cesarean Delivery No complaints.  Pain controlled.  Lochia normal.  Breast feeding yes.  Ambulating well. Pt desires early discharge.  Couple does not want circumcision.  Objective: Temp:  [98.1 F (36.7 C)-99.3 F (37.4 C)] 99.3 F (37.4 C) (08/23 1405) Pulse Rate:  [85-105] 96 (08/23 1405) Resp:  [18-20] 18 (08/23 1405) BP: (116-123)/(73-89) 123/89 (08/23 1405) SpO2:  [100 %] 100 % (08/23 0616)  Physical Exam: Gen: NAD Abd:  Distended Lochia: Not visualized Uterine Fundus: firm, appropriately tender Incision: clean, dry and intact, healing well DVT Evaluation: + Edema present, no calf tenderness bilaterally   Recent Labs    01/11/20 0352  HGB 10.1*  HCT 29.6*    Assessment/Plan: Status post C-section-doing well postoperatively. Routine post op care. Encouraged ambulation in halls TID. Lactation support.  Consider discharge tomorrow afternoon in that pt delivered early on POD #0.    Thurnell Lose 01/12/2020, 2:17 PM

## 2020-01-13 LAB — SURGICAL PATHOLOGY

## 2020-01-13 MED FILL — oxyCODONE HCL 5 MG TABS: 5 | 2 days supply | Qty: 10 | Fill #0

## 2020-01-13 MED FILL — IBUPROFEN 600 MG TABLET: 600 | 7 days supply | Qty: 30 | Fill #0

## 2020-01-13 NOTE — Discharge Summary (Signed)
   Postpartum Discharge Summary    Patient Name: Pamela Simpson DOB: 11/13/1980 MRN: [id number]  Date of admission: 01/09/2020 Delivery date:01/10/2020  Delivering provider: VARNADO, EVELYN  Date of discharge: 01/13/2020  Admitting diagnosis: AMA (advanced maternal age) primigravida 35+, third trimester [O09.513] Intrauterine pregnancy: [redacted]w[redacted]d     Secondary diagnosis:  Active Problems:   AMA (advanced maternal age) primigravida 35+, third trimester  Additional problems:     Discharge diagnosis: Term Pregnancy Delivered                                              Post partum procedures:none Augmentation: Pitocin and Cytotec Complications: None  Hospital course: Induction of Labor With Cesarean Section   39 y.o. yo G1P0 at [redacted]w[redacted]d was admitted to the hospital 01/09/2020 for induction of labor. Patient had a labor course significant for failure to progress. The patient went for cesarean section due to Arrest of Dilation. Delivery details are as follows: Membrane Rupture Time/Date: 9:26 AM ,01/09/2020   Delivery Method:C-Section, Low Transverse  Details of operation can be found in separate operative Note.  Patient had an uncomplicated postpartum course. She is ambulating, tolerating a regular diet, passing flatus, and urinating well.  Patient is discharged home in stable condition on 01/13/20.      Newborn Data: Birth date:01/10/2020  Birth time:6:15 AM  Gender:Female  Living status:Living  Apgars:9 ,9  Weight:3776 g                                 Magnesium Sulfate received: No BMZ received: No Rhophylac:No MMR:No T-DaP:Given prenatally Flu: N/A Transfusion:No  Physical exam  Vitals:   01/12/20 0616 01/12/20 1405 01/13/20 0158 01/13/20 0545  BP: 120/73 123/89 127/83 121/82  Pulse: 95 96 81 82  Resp: 18 18 17 19  Temp: 98.3 F (36.8 C) 99.3 F (37.4 C) 98.4 F (36.9 C) 98.1 F (36.7 C)  TempSrc: Oral Oral Oral Oral  SpO2: 100%  100% 100%  Weight:      Height:        General: alert, cooperative and no distress Lochia: appropriate Uterine Fundus: firm Incision: Dressing is clean, dry, and intact DVT Evaluation: No evidence of DVT seen on physical exam. Labs: Lab Results  Component Value Date   WBC 18.8 (H) 01/11/2020   HGB 10.1 (L) 01/11/2020   HCT 29.6 (L) 01/11/2020   MCV 77.3 (L) 01/11/2020   PLT 319 01/11/2020   CMP Latest Ref Rng & Units 01/22/2019  Glucose 70 - 99 mg/dL 87  BUN 6 - 23 mg/dL 9  Creatinine 0.40 - 1.20 mg/dL 0.69  Sodium 135 - 145 mEq/L 138  Potassium 3.5 - 5.1 mEq/L 4.4  Chloride 96 - 112 mEq/L 101  CO2 19 - 32 mEq/L 31  Calcium 8.4 - 10.5 mg/dL 9.8  Total Protein 6.0 - 8.3 g/dL 7.2  Total Bilirubin 0.2 - 1.2 mg/dL 0.4  Alkaline Phos 39 - 117 U/L 37(L)  AST 0 - 37 U/L 11  ALT 0 - 35 U/L 11   Edinburgh Score: Edinburgh Postnatal Depression Scale Screening Tool 01/11/2020  I have been able to laugh and see the funny side of things. 0  I have looked forward with enjoyment to things. 0  I have blamed myself unnecessarily when things went   wrong. 1  I have been anxious or worried for no good reason. 0  I have felt scared or panicky for no good reason. 0  Things have been getting on top of me. 0  I have been so unhappy that I have had difficulty sleeping. 0  I have felt sad or miserable. 0  I have been so unhappy that I have been crying. 0  The thought of harming myself has occurred to me. 0  Edinburgh Postnatal Depression Scale Total 1      After visit meds:  Allergies as of 01/13/2020   No Known Allergies     Medication List    TAKE these medications   acetaminophen 500 MG tablet Commonly known as: TYLENOL Take 2 tablets (1,000 mg total) by mouth every 6 (six) hours.   ibuprofen 600 MG tablet Commonly known as: ADVIL Take 1 tablet (600 mg total) by mouth every 6 (six) hours as needed.   Integra 62.5-62.5-40-3 MG Caps Take 1 capsule by mouth daily.   levocetirizine 5 MG tablet Commonly known as:  XYZAL Take 5 mg by mouth every evening.   oxyCODONE 5 MG immediate release tablet Commonly known as: Oxy IR/ROXICODONE Take 1 tablet (5 mg total) by mouth every 4 (four) hours as needed for severe pain or breakthrough pain.   pantoprazole 40 MG tablet Commonly known as: PROTONIX Take 40 mg by mouth daily.   prenatal multivitamin Tabs tablet Take 1 tablet by mouth daily at 12 noon.            Discharge Care Instructions  (From admission, onward)         Start     Ordered   01/12/20 0000  Discharge wound care:       Comments: Remove Honeycomb dressing on Wednesday, 01/14/20.   01/12/20 1813           Discharge home in stable condition Infant Feeding: Breast Infant Disposition:NICU Discharge instruction: per After Visit Summary and Postpartum booklet. Activity: Advance as tolerated. Pelvic rest for 6 weeks.  Diet: routine diet Anticipated Birth Control: Unsure Postpartum Appointment:2 weeks Additional Postpartum F/U: Incision check 2 weeks Future Appointments:No future appointments. Follow up Visit:  Follow-up Information    Davies, Melissa, DO. Schedule an appointment as soon as possible for a visit in 2 week(s).   Specialty: Obstetrics and Gynecology Contact information: [address]  [place] [address] [phone]                   01/13/2020  M , DO  

## 2020-01-13 NOTE — Progress Notes (Signed)
Discharge instructions given to and reviewed with pt with verbalization of understanding. Pt to remain in couplet care room with infant.

## 2020-01-13 NOTE — Progress Notes (Signed)
Patient screened out for psychosocial assessment since none of the following apply:  Psychosocial stressors documented in mother or baby's chart  Gestation less than 32 weeks  Code at delivery   Infant with anomalies Please contact the Clinical Social Worker if specific needs arise, by MOB's request, or if MOB scores greater than 9/yes to question 10 on Edinburgh Postpartum Depression Screen.  Adrina Armijo, LCSW Clinical Social Worker [facility] Cell#: [phone]     

## 2020-01-14 ENCOUNTER — Ambulatory Visit: Payer: Self-pay

## 2020-01-14 NOTE — Lactation Note (Signed)
This note was copied from a baby's chart. Lactation Consultation Note  Patient Name: Pamela Simpson [id number] Date: 01/14/2020 Reason for consult: Follow-up assessment;NICU baby;Primapara;1st time breastfeeding  1103 - 1144 - I followed up with Ms. Chaudhuri and her 72 day old son, Hetty Blend. Ms. Eversley and her husband. Hetty Blend has been having some difficulties feeding at the breast. I offered to assist and I then gathered a history of [name]'s feeding progress.  We began by placing [name] in football hold on the left breast. Ms. Oats has a generous milk supply, routinely pumping 110+ mls.  [name] pulled off the breast in football hold and would not latch. I placed a size 24 NS on this side, and he latched with some suckling sequences. He would pull back and arch at the breast and then go back on. We took him off, and Ms. Haile held him. While she held him, he stooled.  After changing the diaper we placed him on the same breast in cradle hold. [name] latched briefly a few times but continued to arch and stiffen. I gave him his pacifier and he softened. I positioned him more comfortably around the breast. I then switched to a size 20 nipple shield. He latched to this and held it in his mouth. Eventually he fell asleep. We worked with him for about 20 minutes, and he became tired. We then notified the RN to gavage feed the remainder.  Plan: At the next feeding, I recommended that we begin with a size 20 nipple shield. I suggested that Ms. Guastella could allow him to suckle on his pacifier first for a few moments to then position him at the breast. Then she could remove the pacifier and latch him. We will follow up to see how this strategy is working and then make modifications as needed.   Maternal Data Has patient been taught Hand Expression?: Yes Does the patient have breastfeeding experience prior to this delivery?: No  Feeding Feeding Type: Breast Fed  LATCH Score Latch: Repeated attempts needed to sustain  latch, nipple held in mouth throughout feeding, stimulation needed to elicit sucking reflex.  Audible Swallowing: A few with stimulation  Type of Nipple: Everted at rest and after stimulation  Comfort (Breast/Nipple): Soft / non-tender  Hold (Positioning): Assistance needed to correctly position infant at breast and maintain latch.  LATCH Score: 7  Interventions Interventions: Breast feeding basics reviewed;Assisted with latch;Skin to skin;Adjust position;Support pillows;Position options  Lactation Tools Discussed/Used Tools: Nipple Shields Nipple shield size: 24;20 Breast pump type: Double-Electric Breast Pump   Consult Status Consult Status: Follow-up Date: 01/15/20 Follow-up type: In-patient    Lenore Manner 01/14/2020, 11:49 AM

## 2020-01-19 MED FILL — PANTOPRAZOLE SOD DR 40 MG T: 40 | 30 days supply | Qty: 30 | Fill #1

## 2020-01-19 MED FILL — FLUTICASONE PROP 50 MCG SPR: 50 | 90 days supply | Qty: 48 | Fill #1

## 2020-01-30 DIAGNOSIS — Z13220 Encounter for screening for lipoid disorders: Secondary | ICD-10-CM | POA: Diagnosis not present

## 2020-01-30 DIAGNOSIS — Z0000 Encounter for general adult medical examination without abnormal findings: Secondary | ICD-10-CM | POA: Diagnosis not present

## 2020-03-16 DIAGNOSIS — Z20822 Contact with and (suspected) exposure to covid-19: Secondary | ICD-10-CM | POA: Diagnosis not present

## 2020-03-24 DIAGNOSIS — H52203 Unspecified astigmatism, bilateral: Secondary | ICD-10-CM | POA: Diagnosis not present

## 2020-04-07 ENCOUNTER — Ambulatory Visit: Payer: 59 | Admitting: Pulmonary Disease

## 2020-04-07 ENCOUNTER — Other Ambulatory Visit: Payer: Self-pay

## 2020-04-07 ENCOUNTER — Encounter: Payer: Self-pay | Admitting: Pulmonary Disease

## 2020-04-07 VITALS — BP 126/76 | HR 84 | Temp 97.4°F | Ht 65.0 in | Wt 159.0 lb

## 2020-04-07 DIAGNOSIS — G4733 Obstructive sleep apnea (adult) (pediatric): Secondary | ICD-10-CM | POA: Diagnosis not present

## 2020-04-07 NOTE — Progress Notes (Signed)
Pamela Simpson    [id number]    October 21, 1980  Primary Care Physician:Polite, Jori Moll, MD  Referring Physician: Seward Carol, MD [address] [place],  [place] [address]  Chief complaint:   Patient being evaluated with a history of obstructive sleep apnea  HPI:  Patient was diagnosed with mild obstructive sleep apnea  Did have more symptoms during pregnancy  Post pregnancy she has been told that she still has snoring Sleep is nonrestorative although this could be multifactorial  Usually goes to bed about 9 PM Might take about an hour to fall asleep 2-3 awakenings final wake up time about 6:15 AM  Weight is up about 10 pounds  Previous sleep study about 3 years ago was revealing for mild obstructive sleep apnea  No family history of obstructive sleep apnea  Never smoker Works at [employer] outpatient   Outpatient Encounter Medications as of 04/07/2020  Medication Sig   acetaminophen (TYLENOL) 500 MG tablet Take 2 tablets (1,000 mg total) by mouth every 6 (six) hours.   levocetirizine (XYZAL) 5 MG tablet Take 5 mg by mouth every evening.   pantoprazole (PROTONIX) 40 MG tablet Take 40 mg by mouth daily.   Prenatal Vit-Fe Fumarate-FA (PRENATAL MULTIVITAMIN) TABS tablet Take 1 tablet by mouth daily at 12 noon.   [DISCONTINUED] Fe Fum-FePoly-Vit C-Vit B3 (INTEGRA) 62.5-62.5-40-3 MG CAPS Take 1 capsule by mouth daily.   [DISCONTINUED] ibuprofen (ADVIL) 600 MG tablet Take 1 tablet (600 mg total) by mouth every 6 (six) hours as needed.   [DISCONTINUED] oxyCODONE (OXY IR/ROXICODONE) 5 MG immediate release tablet Take 1 tablet (5 mg total) by mouth every 4 (four) hours as needed for severe pain or breakthrough pain.   No facility-administered encounter medications on file as of 04/07/2020.    Allergies as of 04/07/2020   (No Known Allergies)    Past Medical History:  Diagnosis Date   GERD (gastroesophageal reflux disease)     Past  Surgical History:  Procedure Laterality Date   CESAREAN SECTION N/A 01/10/2020   Procedure: CESAREAN SECTION;  Surgeon: Thurnell Lose, MD;  Location: [facility] LD ORS;  Service: Obstetrics;  Laterality: N/A;  Failure to progress   LIPOMA EXCISION      Family History  Problem Relation Age of Onset   Heart attack Mother    Stroke Father    Diabetes Father    Healthy Sister     Social History   Socioeconomic History   Marital status: Married    Spouse name: Not on file   Number of children: 0   Years of education: Not on file   Highest education level: Not on file  Occupational History   Occupation: RN clinical lead    Employer: [employer]  Tobacco Use   Smoking status: Never Smoker   Smokeless tobacco: Never Used  Scientific laboratory technician Use: Never used  Substance and Sexual Activity   Alcohol use: Yes   Drug use: Never   Sexual activity: Yes  Other Topics Concern   Not on file  Social History Narrative   Not on file   Social Determinants of Health   Financial Resource Strain:    Difficulty of Paying Living Expenses: Not on file  Food Insecurity:    Worried About [place] in the Last Year: Not on file   [employer] of Food in the Last Year: Not on file  Transportation Needs:  Lack of Transportation (Medical): Not on file   Lack of Transportation (Non-Medical): Not on file  Physical Activity:    Days of Exercise per Week: Not on file   Minutes of Exercise per Session: Not on file  Stress:    Feeling of Stress : Not on file  Social Connections:    Frequency of Communication with Friends and Family: Not on file   Frequency of Social Gatherings with Friends and Family: Not on file   Attends Religious Services: Not on file   Active Member of Clubs or Organizations: Not on file   Attends Archivist Meetings: Not on file   Marital Status: Not on file  Intimate Partner Violence:    Fear of Current or Ex-Partner: Not on  file   Emotionally Abused: Not on file   Physically Abused: Not on file   Sexually Abused: Not on file    Review of Systems  Respiratory: Positive for apnea.   Psychiatric/Behavioral: Positive for sleep disturbance.    Vitals:   04/07/20 1354  BP: 126/76  Pulse: 84  Temp: (!) 97.4 F (36.3 C)  SpO2: 99%     Physical Exam Constitutional:      Appearance: Normal appearance.  HENT:     Head: Normocephalic.     Nose: No congestion or rhinorrhea.     Mouth/Throat:     Comments: Mallampati 3, crowded oropharynx Eyes:     General:        Right eye: No discharge.        Left eye: No discharge.  Cardiovascular:     Rate and Rhythm: Normal rate and regular rhythm.     Pulses: Normal pulses.     Heart sounds: No murmur heard.  No friction rub.  Pulmonary:     Effort: No respiratory distress.     Breath sounds: No stridor. No wheezing or rhonchi.  Musculoskeletal:     Cervical back: No rigidity or tenderness.  Neurological:     Mental Status: She is alert.  Psychiatric:        Mood and Affect: Mood normal.    Results of the Epworth flowsheet 04/07/2020  Sitting and reading 1  Watching TV 1  Sitting, inactive in a public place (e.g. a theatre or a meeting) 0  As a passenger in a car for an hour without a break 0  Lying down to rest in the afternoon when circumstances permit 2  Sitting and talking to someone 0  Sitting quietly after a lunch without alcohol 0  In a car, while stopped for a few minutes in traffic 0  Total score 4    Data Reviewed:  Previous home sleep study reported as mild sleep apnea Assessment:  History of mild sleep apnea with persistence of snoring, apneas  Nonrestorative sleep  Pathophysiology of sleep disordered breathing discussed with the patient Treatment options for sleep disordered breathing discussed with the patient  Plan/Recommendations: We will schedule the patient for home sleep study  Update patient once  reviewed  Follow-up in 3 months  Encouraged to call with any significant concerns Sherrilyn Rist MD [facility] 04/07/2020, 5:16 PM  CC: Seward Carol, MD

## 2020-04-07 NOTE — Patient Instructions (Signed)
History of obstructive sleep apnea  Schedule you for a home sleep study We will update you as soon as results are available  Options of treatment will include CPAP or an oral device  Tentative follow-up in 3 months  Call with significant concerns

## 2020-04-08 MED FILL — PANTOPRAZOLE SOD DR 40 MG T: 40 | 90 days supply | Qty: 90 | Fill #2

## 2020-05-03 ENCOUNTER — Other Ambulatory Visit: Payer: Self-pay

## 2020-05-03 ENCOUNTER — Ambulatory Visit: Payer: 59

## 2020-05-03 DIAGNOSIS — G4733 Obstructive sleep apnea (adult) (pediatric): Secondary | ICD-10-CM

## 2020-05-13 ENCOUNTER — Telehealth: Payer: Self-pay | Admitting: Pulmonary Disease

## 2020-05-13 DIAGNOSIS — G4733 Obstructive sleep apnea (adult) (pediatric): Secondary | ICD-10-CM | POA: Diagnosis not present

## 2020-05-13 NOTE — Telephone Encounter (Signed)
Call patient  Sleep study result  Date of study: 05/03/2020  Impression: Mild obstructive sleep apnea Mild oxygen desaturations  Recommendation: Options of treatment for mild obstructive sleep apnea will include CPAP therapy, an oral device, watchful waiting with weight loss measures  CPAP therapy may be considered if patient has significant symptoms however if daytime symptoms are mild watchful waiting may be appropriate  An oral device may also be considered for treatment of mild obstructive sleep apnea-this will entail referral to a dentist to fashion an oral device  Follow-up as previously scheduled

## 2020-05-17 NOTE — Telephone Encounter (Signed)
Called and spoke with patient about sleep study results per Dr. Wynona Neat. Patient wants to do some research on her own as far as which option in better and also check with her insurance as far as coverage and cost. Patient states that she would like to be referred for oral appliance right now and then if anything changes she will call the office to let us know. AMB referral has been placed. Nothing further needed at this time.

## 2020-06-01 ENCOUNTER — Other Ambulatory Visit (HOSPITAL_COMMUNITY): Payer: Self-pay | Admitting: Otolaryngology

## 2020-06-01 DIAGNOSIS — H6122 Impacted cerumen, left ear: Secondary | ICD-10-CM | POA: Diagnosis not present

## 2020-06-01 DIAGNOSIS — H9042 Sensorineural hearing loss, unilateral, left ear, with unrestricted hearing on the contralateral side: Secondary | ICD-10-CM | POA: Diagnosis not present

## 2020-06-01 MED FILL — predniSONE 10 MG TABS: 10 | 6 days supply | Qty: 21 | Fill #0

## 2020-06-01 MED FILL — FLUTICASONE PROP 50 MCG SPR: 50 | 30 days supply | Qty: 16 | Fill #0

## 2020-06-15 ENCOUNTER — Other Ambulatory Visit (HOSPITAL_COMMUNITY): Payer: Self-pay | Admitting: Otolaryngology

## 2020-06-15 DIAGNOSIS — H9122 Sudden idiopathic hearing loss, left ear: Secondary | ICD-10-CM | POA: Diagnosis not present

## 2020-06-15 DIAGNOSIS — H9312 Tinnitus, left ear: Secondary | ICD-10-CM | POA: Diagnosis not present

## 2020-06-15 DIAGNOSIS — H9042 Sensorineural hearing loss, unilateral, left ear, with unrestricted hearing on the contralateral side: Secondary | ICD-10-CM | POA: Diagnosis not present

## 2020-06-15 MED FILL — predniSONE 10 MG TABS: 10 | 12 days supply | Qty: 48 | Fill #0

## 2020-06-28 DIAGNOSIS — D649 Anemia, unspecified: Secondary | ICD-10-CM | POA: Diagnosis not present

## 2020-06-28 DIAGNOSIS — R5383 Other fatigue: Secondary | ICD-10-CM | POA: Diagnosis not present

## 2020-06-29 DIAGNOSIS — Z01419 Encounter for gynecological examination (general) (routine) without abnormal findings: Secondary | ICD-10-CM | POA: Diagnosis not present

## 2020-06-30 DIAGNOSIS — G4733 Obstructive sleep apnea (adult) (pediatric): Secondary | ICD-10-CM

## 2020-07-01 NOTE — Telephone Encounter (Signed)
Can we place an order for CPAP  Auto CPAP 5-15 with heated humidification Patient's mask of choice.

## 2020-07-01 NOTE — Telephone Encounter (Signed)
Patient sent email   I wanted to reach out to see if possible I could have an order placed for a CPAP? I know I was given the option of an oral device or CPAP and have now decided I would be better suited for the CPAP. Please advise on my next steps  I think Mandi put in order for oral devise, but on the results there wasn't order for a CPAP   Dr. Ander Slade please advise.

## 2020-07-06 ENCOUNTER — Other Ambulatory Visit: Payer: Self-pay | Admitting: Otolaryngology

## 2020-07-06 ENCOUNTER — Other Ambulatory Visit (HOSPITAL_COMMUNITY): Payer: Self-pay | Admitting: Otolaryngology

## 2020-07-06 DIAGNOSIS — H8102 Meniere's disease, left ear: Secondary | ICD-10-CM | POA: Diagnosis not present

## 2020-07-06 DIAGNOSIS — H918X9 Other specified hearing loss, unspecified ear: Secondary | ICD-10-CM

## 2020-07-06 DIAGNOSIS — H9042 Sensorineural hearing loss, unilateral, left ear, with unrestricted hearing on the contralateral side: Secondary | ICD-10-CM | POA: Diagnosis not present

## 2020-07-18 ENCOUNTER — Ambulatory Visit (HOSPITAL_COMMUNITY)
Admission: RE | Admit: 2020-07-18 | Discharge: 2020-07-18 | Disposition: A | Discharge status: Home / Self Care | Payer: 59 | Source: Ambulatory Visit | Attending: Otolaryngology | Admitting: Otolaryngology

## 2020-07-18 DIAGNOSIS — R519 Headache, unspecified: Secondary | ICD-10-CM | POA: Diagnosis not present

## 2020-07-18 DIAGNOSIS — H918X9 Other specified hearing loss, unspecified ear: Secondary | ICD-10-CM | POA: Diagnosis not present

## 2020-07-18 MED ORDER — GADOBUTROL 1 MMOL/ML IV SOLN
7.0000 mL | Freq: Once | INTRAVENOUS | Status: AC | PRN
  Administered 2020-07-18: 7 mL via INTRAVENOUS

## 2020-07-19 ENCOUNTER — Ambulatory Visit (HOSPITAL_COMMUNITY): Admission: RE | Admit: 2020-07-19 | Payer: 59 | Source: Ambulatory Visit

## 2020-09-07 DIAGNOSIS — G4733 Obstructive sleep apnea (adult) (pediatric): Secondary | ICD-10-CM | POA: Diagnosis not present

## 2020-09-15 ENCOUNTER — Other Ambulatory Visit (HOSPITAL_COMMUNITY): Payer: Self-pay

## 2020-09-15 MED FILL — Pantoprazole Sodium EC Tab 40 MG (Base Equiv): ORAL | 60 days supply | Qty: 60 | Fill #0 | Status: AC

## 2020-10-07 DIAGNOSIS — G4733 Obstructive sleep apnea (adult) (pediatric): Secondary | ICD-10-CM | POA: Diagnosis not present

## 2020-10-19 IMAGING — US US BREAST*L* LIMITED INC AXILLA
1 series · 13 of 16 positions shown · non-contrast
Comparison: None.
COMPARISON: None.

Addendum:
CLINICAL DATA: Pregnant patient with a new palpable lump in the
left breast.

EXAM:
ULTRASOUND OF THE LEFT BREAST

[Series 1: us breast*left* limited inc axilla · 0.06mm/px · 13 of 16 slices shown]
[im 1/16]
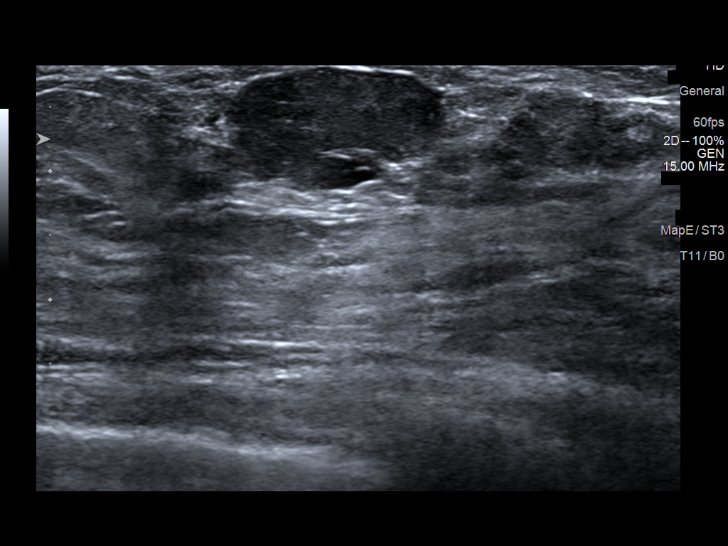
[im 2/16]
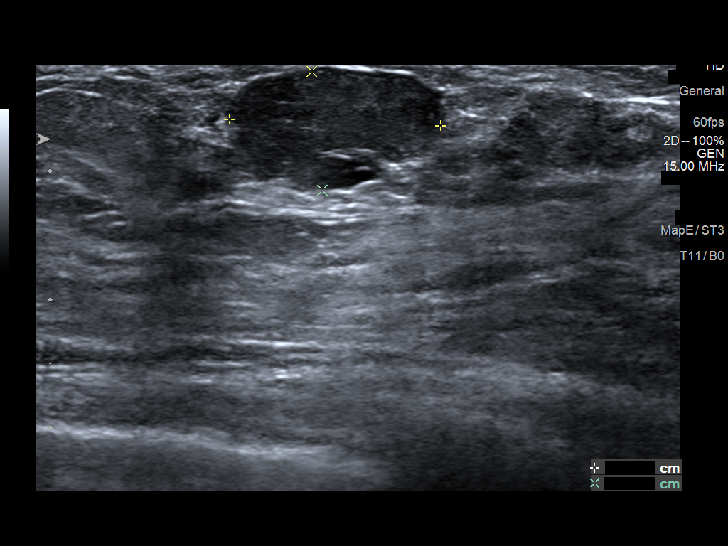
[im 4/16]
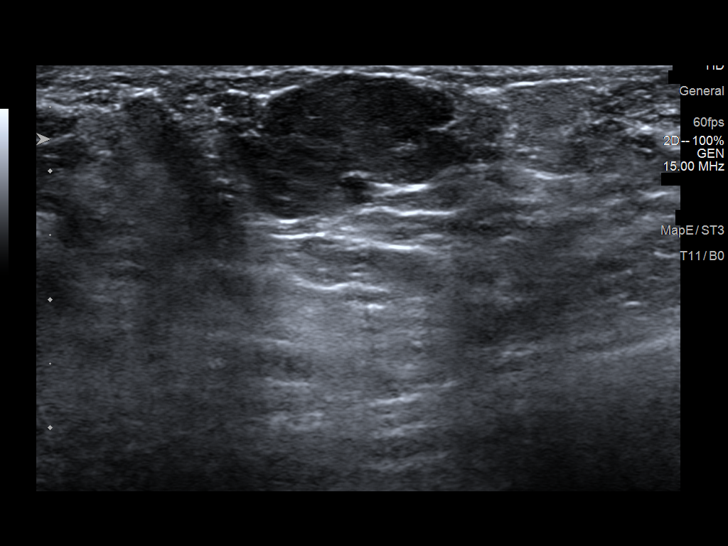
[im 5/16]
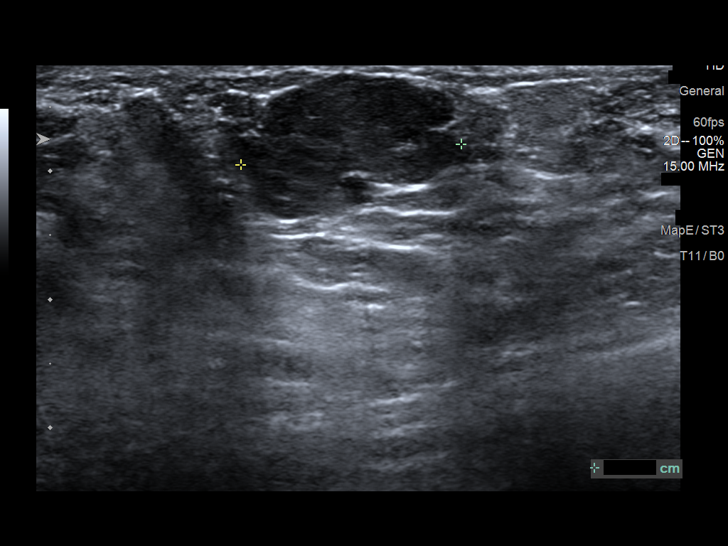
[im 6/16]
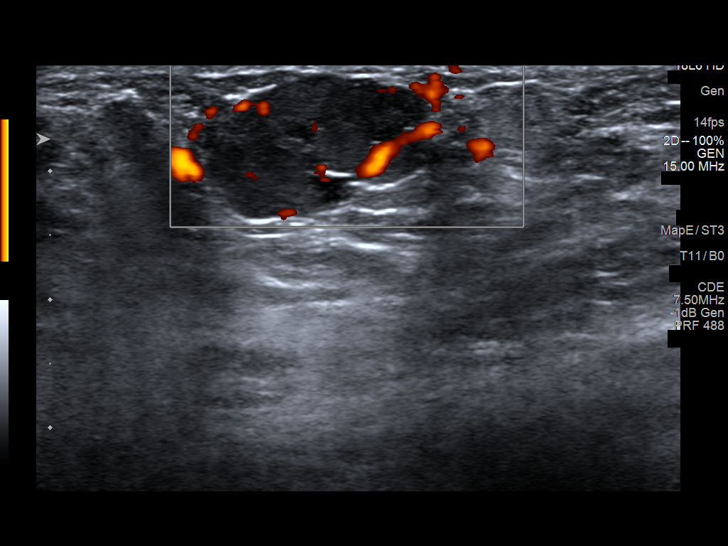
[im 7/16]
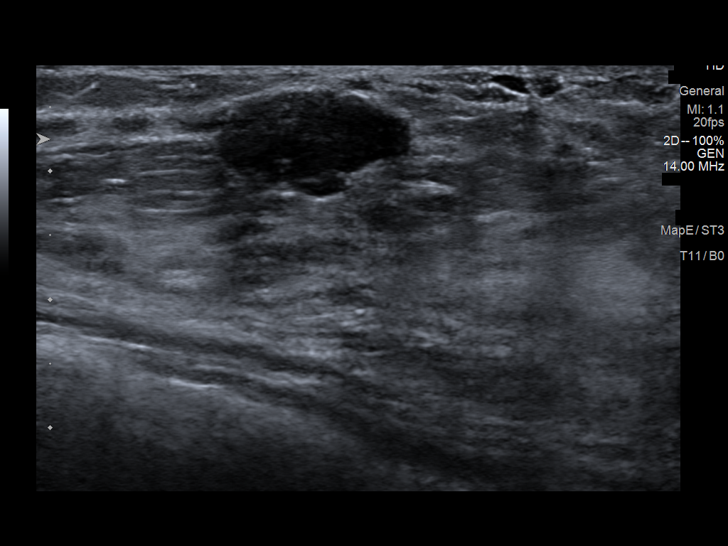
[im 9/16]
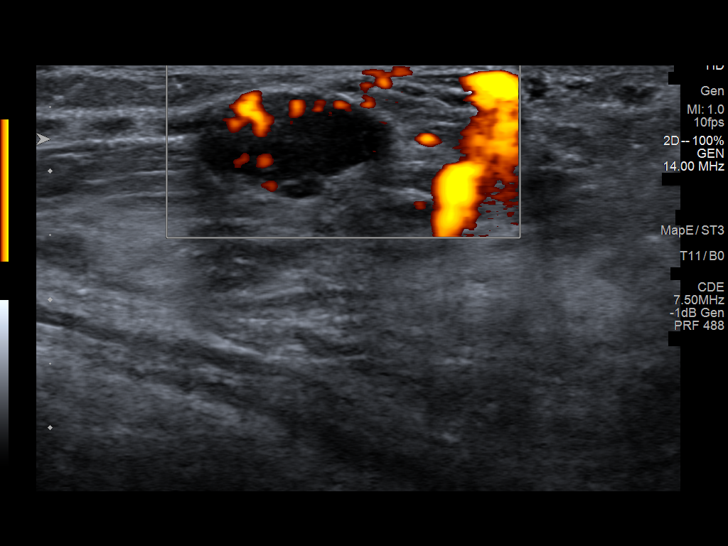
[im 10/16]
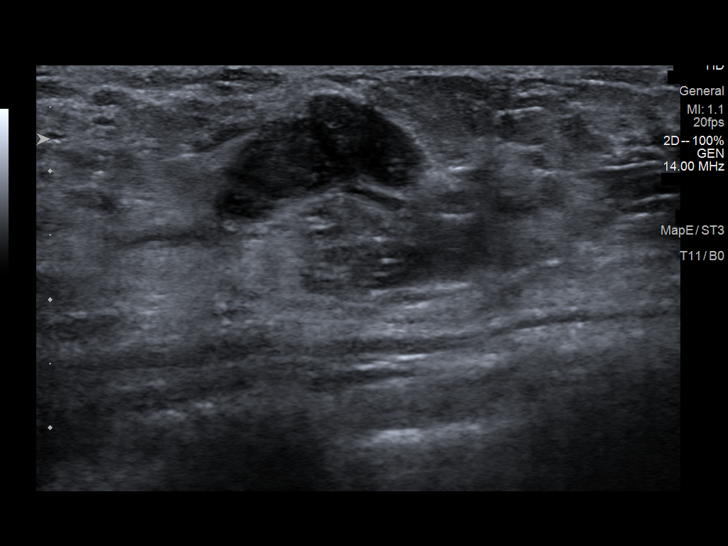
[im 11/16]
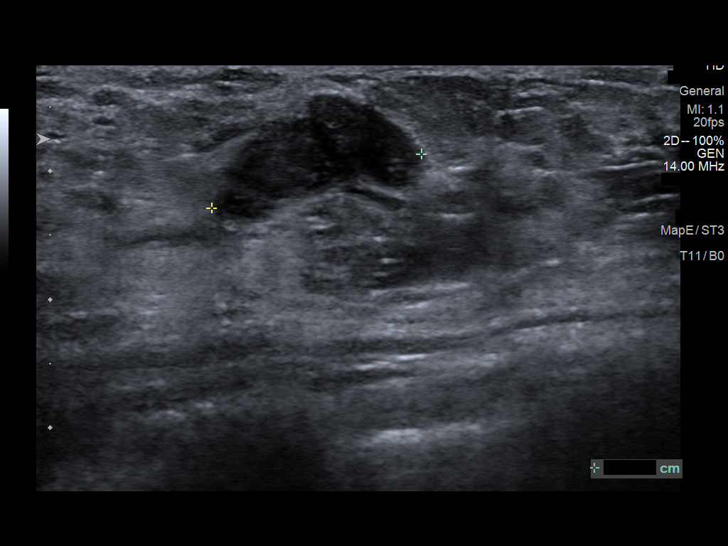
[im 12/16]
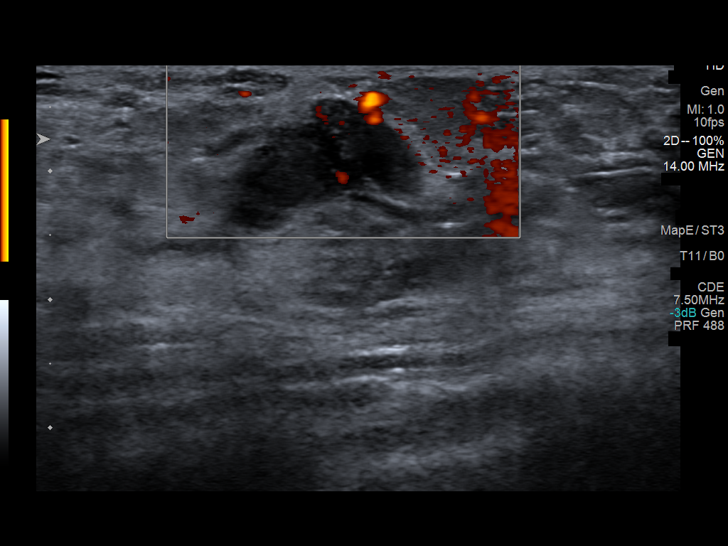
[im 13/16]
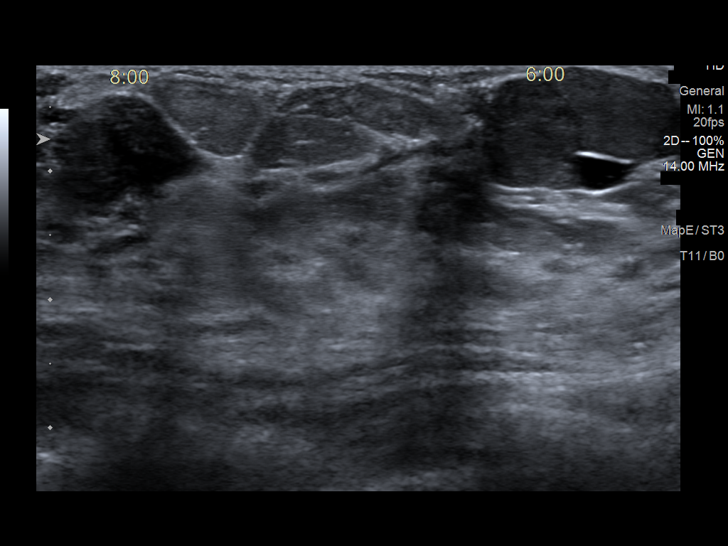
[im 15/16]
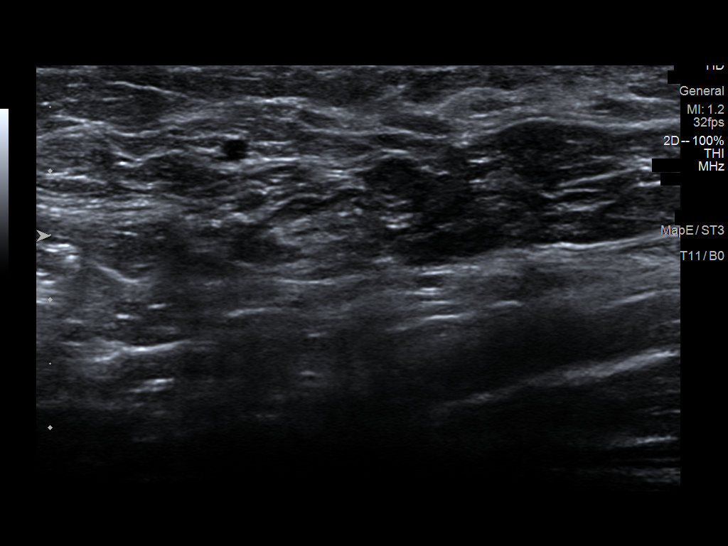
[im 16/16]
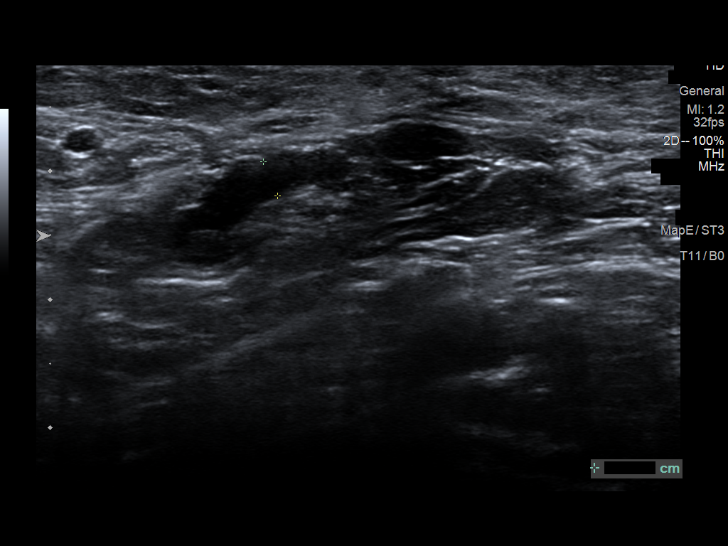

[13 of 16 positions shown; findings below may reference images not displayed]

FINDINGS: On physical exam, a palpable lump is felt at 6 o'clock in the left
breast.

Targeted ultrasound is performed, showing a hypoechoic mass in the
left breast at 6 o'clock, 3 cm from the nipple measuring 1.7 x 0.9 x
1.7 cm. An adjacent mildly irregular masses seen at 8 o'clock, 3 cm
from the nipple measuring 1.5 x 0.8 x 1.7 cm. No axillary
adenopathy.
IMPRESSION: Two indeterminate masses in the left breast as described above. The
patient feels that the mass at 6 o'clock is new. The mass at 8
o'clock cannot be palpated.

RECOMMENDATION:
Recommend ultrasound-guided biopsy of both the 6 o'clock and 8
o'clock left breast masses in this pregnant patient.

I have discussed the findings and recommendations with the patient.
If applicable, a reminder letter will be sent to the patient
regarding the next appointment.

BI-RADS CATEGORY  4: Suspicious.

ADDENDUM:
An outside ultrasound of the left breast was performed from [DATE] to
[DATE]. There is no correlate on the outside ultrasound to the September 03, 2019 study.

*** End of Addendum ***
FINDINGS: On physical exam, a palpable lump is felt at 6 o'clock in the left
breast.

Targeted ultrasound is performed, showing a hypoechoic mass in the
left breast at 6 o'clock, 3 cm from the nipple measuring 1.7 x 0.9 x
1.7 cm. An adjacent mildly irregular masses seen at 8 o'clock, 3 cm
from the nipple measuring 1.5 x 0.8 x 1.7 cm. No axillary
adenopathy.
IMPRESSION: Two indeterminate masses in the left breast as described above. The
patient feels that the mass at 6 o'clock is new. The mass at 8
o'clock cannot be palpated.

RECOMMENDATION:
Recommend ultrasound-guided biopsy of both the 6 o'clock and 8
o'clock left breast masses in this pregnant patient.

I have discussed the findings and recommendations with the patient.
If applicable, a reminder letter will be sent to the patient
regarding the next appointment.

BI-RADS CATEGORY  4: Suspicious.

## 2020-11-07 DIAGNOSIS — G4733 Obstructive sleep apnea (adult) (pediatric): Secondary | ICD-10-CM | POA: Diagnosis not present

## 2020-12-07 DIAGNOSIS — G4733 Obstructive sleep apnea (adult) (pediatric): Secondary | ICD-10-CM | POA: Diagnosis not present

## 2020-12-15 DIAGNOSIS — R5383 Other fatigue: Secondary | ICD-10-CM | POA: Diagnosis not present

## 2020-12-15 DIAGNOSIS — F419 Anxiety disorder, unspecified: Secondary | ICD-10-CM | POA: Diagnosis not present

## 2020-12-15 DIAGNOSIS — Z79899 Other long term (current) drug therapy: Secondary | ICD-10-CM | POA: Diagnosis not present

## 2020-12-22 DIAGNOSIS — G4733 Obstructive sleep apnea (adult) (pediatric): Secondary | ICD-10-CM | POA: Diagnosis not present

## 2021-01-13 ENCOUNTER — Other Ambulatory Visit (HOSPITAL_COMMUNITY): Payer: Self-pay

## 2021-01-13 MED ORDER — CARESTART COVID-19 HOME TEST VI KIT
PACK | 0 refills | Status: DC
Start: 2021-01-13 — End: 2023-02-14
  Filled 2021-01-13: qty 4, 4d supply, fill #0

## 2021-01-17 ENCOUNTER — Other Ambulatory Visit (HOSPITAL_BASED_OUTPATIENT_CLINIC_OR_DEPARTMENT_OTHER): Payer: Self-pay

## 2021-01-17 MED ORDER — AMOXICILLIN-POT CLAVULANATE 875-125 MG PO TABS
ORAL_TABLET | ORAL | 0 refills | Status: DC
  Filled 2021-01-17 – 2021-01-21 (×2): qty 20, 10d supply, fill #0

## 2021-01-20 DIAGNOSIS — H9202 Otalgia, left ear: Secondary | ICD-10-CM | POA: Diagnosis not present

## 2021-01-21 ENCOUNTER — Other Ambulatory Visit (HOSPITAL_BASED_OUTPATIENT_CLINIC_OR_DEPARTMENT_OTHER): Payer: Self-pay

## 2021-02-10 DIAGNOSIS — F419 Anxiety disorder, unspecified: Secondary | ICD-10-CM | POA: Diagnosis not present

## 2021-02-10 DIAGNOSIS — E559 Vitamin D deficiency, unspecified: Secondary | ICD-10-CM | POA: Diagnosis not present

## 2021-02-10 DIAGNOSIS — Z0000 Encounter for general adult medical examination without abnormal findings: Secondary | ICD-10-CM | POA: Diagnosis not present

## 2021-02-10 DIAGNOSIS — Z13220 Encounter for screening for lipoid disorders: Secondary | ICD-10-CM | POA: Diagnosis not present

## 2021-02-21 ENCOUNTER — Other Ambulatory Visit (HOSPITAL_COMMUNITY): Payer: Self-pay

## 2021-03-11 ENCOUNTER — Other Ambulatory Visit (HOSPITAL_COMMUNITY): Payer: Self-pay

## 2021-03-11 MED ORDER — FLUTICASONE PROPIONATE 50 MCG/ACT NA SUSP
1.0000 | Freq: Every day | NASAL | 3 refills | Status: AC
  Filled 2021-03-11: qty 16, 60d supply, fill #0
  Filled 2021-11-17: qty 16, 60d supply, fill #1
  Filled 2021-12-27: qty 16, 60d supply, fill #2

## 2021-03-11 MED ORDER — PANTOPRAZOLE SODIUM 40 MG PO TBEC
40.0000 mg | DELAYED_RELEASE_TABLET | Freq: Every day | ORAL | 3 refills | Status: DC
  Filled 2021-03-11: qty 90, 90d supply, fill #0
  Filled 2021-08-10: qty 90, 90d supply, fill #1

## 2021-03-11 MED ORDER — ALPRAZOLAM 0.25 MG PO TABS
0.2500 mg | ORAL_TABLET | Freq: Every day | ORAL | 0 refills | Status: DC | PRN
  Filled 2021-03-11: qty 15, 15d supply, fill #0

## 2021-03-23 DIAGNOSIS — G4733 Obstructive sleep apnea (adult) (pediatric): Secondary | ICD-10-CM | POA: Diagnosis not present

## 2021-04-07 DIAGNOSIS — H52203 Unspecified astigmatism, bilateral: Secondary | ICD-10-CM | POA: Diagnosis not present

## 2021-05-09 DIAGNOSIS — F411 Generalized anxiety disorder: Secondary | ICD-10-CM | POA: Diagnosis not present

## 2021-05-24 DIAGNOSIS — F411 Generalized anxiety disorder: Secondary | ICD-10-CM | POA: Diagnosis not present

## 2021-06-13 ENCOUNTER — Telehealth (HOSPITAL_COMMUNITY): Payer: Self-pay | Admitting: Lactation Services

## 2021-06-13 NOTE — Telephone Encounter (Signed)
Patient called with questions about weaning. She has been lactating for 18 months & pumps and BO only. Patient pumps qid for 8-9 min on each breast.   She is able to express 6-8 oz with each pumping session. I recommended that she drop a couple of minutes from each pumping session, wait a few days, and, if her body seems ready, shave a couple of more minutes from her time pumping until she is able to skip a session entirely.   I gave anticipatory guidance in regards to this may take a couple of weeks and there may be some transient fullness and redness.   Elinor Dodge, RN, IBCLC

## 2021-06-29 DIAGNOSIS — Z01419 Encounter for gynecological examination (general) (routine) without abnormal findings: Secondary | ICD-10-CM | POA: Diagnosis not present

## 2021-06-29 DIAGNOSIS — E049 Nontoxic goiter, unspecified: Secondary | ICD-10-CM | POA: Diagnosis not present

## 2021-06-29 DIAGNOSIS — Z124 Encounter for screening for malignant neoplasm of cervix: Secondary | ICD-10-CM | POA: Diagnosis not present

## 2021-07-11 ENCOUNTER — Other Ambulatory Visit (HOSPITAL_COMMUNITY): Payer: Self-pay

## 2021-07-11 DIAGNOSIS — E041 Nontoxic single thyroid nodule: Secondary | ICD-10-CM | POA: Diagnosis not present

## 2021-07-11 DIAGNOSIS — F419 Anxiety disorder, unspecified: Secondary | ICD-10-CM | POA: Diagnosis not present

## 2021-07-11 MED ORDER — ESCITALOPRAM OXALATE 10 MG PO TABS
10.0000 mg | ORAL_TABLET | Freq: Every day | ORAL | 11 refills | Status: DC
  Filled 2021-07-11: qty 30, 30d supply, fill #0
  Filled 2021-08-10: qty 30, 30d supply, fill #1
  Filled 2021-09-12: qty 30, 30d supply, fill #2
  Filled 2021-10-10: qty 30, 30d supply, fill #3
  Filled 2021-11-17: qty 30, 30d supply, fill #4
  Filled 2021-12-27: qty 30, 30d supply, fill #5

## 2021-07-20 DIAGNOSIS — E041 Nontoxic single thyroid nodule: Secondary | ICD-10-CM | POA: Diagnosis not present

## 2021-07-20 DIAGNOSIS — E042 Nontoxic multinodular goiter: Secondary | ICD-10-CM | POA: Diagnosis not present

## 2021-08-06 ENCOUNTER — Emergency Department (HOSPITAL_COMMUNITY): Payer: 59

## 2021-08-06 ENCOUNTER — Other Ambulatory Visit: Payer: Self-pay

## 2021-08-06 ENCOUNTER — Emergency Department (HOSPITAL_COMMUNITY)
Admission: EM | Admit: 2021-08-06 | Discharge: 2021-08-06 | Disposition: A | Discharge status: Home / Self Care | Payer: 59 | Attending: Emergency Medicine | Admitting: Emergency Medicine

## 2021-08-06 DIAGNOSIS — F419 Anxiety disorder, unspecified: Secondary | ICD-10-CM | POA: Insufficient documentation

## 2021-08-06 DIAGNOSIS — R5381 Other malaise: Secondary | ICD-10-CM | POA: Insufficient documentation

## 2021-08-06 DIAGNOSIS — R0602 Shortness of breath: Secondary | ICD-10-CM | POA: Diagnosis present

## 2021-08-06 DIAGNOSIS — Z79899 Other long term (current) drug therapy: Secondary | ICD-10-CM | POA: Diagnosis not present

## 2021-08-06 DIAGNOSIS — R0789 Other chest pain: Secondary | ICD-10-CM | POA: Diagnosis not present

## 2021-08-06 DIAGNOSIS — R42 Dizziness and giddiness: Secondary | ICD-10-CM | POA: Diagnosis not present

## 2021-08-06 LAB — CBC
HCT: 35.2 % — ABNORMAL LOW (ref 36.0–46.0)
Hemoglobin: 11.9 g/dL — ABNORMAL LOW (ref 12.0–15.0)
MCH: 27.1 pg (ref 26.0–34.0)
MCHC: 33.8 g/dL (ref 30.0–36.0)
MCV: 80.2 fL (ref 80.0–100.0)
Platelets: 356 10*3/uL (ref 150–400)
RBC: 4.39 MIL/uL (ref 3.87–5.11)
RDW: 14.6 % (ref 11.5–15.5)
WBC: 5.2 10*3/uL (ref 4.0–10.5)
nRBC: 0 % (ref 0.0–0.2)

## 2021-08-06 LAB — BASIC METABOLIC PANEL
Anion gap: 7 (ref 5–15)
BUN: 13 mg/dL (ref 6–20)
CO2: 26 mmol/L (ref 22–32)
Calcium: 9.1 mg/dL (ref 8.9–10.3)
Chloride: 102 mmol/L (ref 98–111)
Creatinine, Ser: 0.76 mg/dL (ref 0.44–1.00)
GFR, Estimated: >60 mL/min (ref >60)
Glucose, Bld: 99 mg/dL (ref 70–99)
Potassium: 3.8 mmol/L (ref 3.5–5.1)
Sodium: 135 mmol/L (ref 135–145)

## 2021-08-06 LAB — TSH: TSH: 0.713 u[IU]/mL (ref 0.350–4.500)

## 2021-08-06 LAB — TROPONIN I (HIGH SENSITIVITY)
Troponin I (High Sensitivity): <2 ng/L (ref <18)
Troponin I (High Sensitivity): <2 ng/L (ref <18)

## 2021-08-06 LAB — I-STAT BETA HCG BLOOD, ED (MC, WL, AP ONLY): I-stat hCG, quantitative: <5 m[IU]/mL (ref <5)

## 2021-08-06 LAB — D-DIMER, QUANTITATIVE: D-Dimer, Quant: 0.47 ug/mL-FEU (ref 0.00–0.50)

## 2021-08-06 NOTE — ED Triage Notes (Signed)
Pt reports recent episodes of racing heart. Attempts at treatment by PCP with anxiety meds, and lexapro w/exacerbation of symptoms. Pt showed no improvement in symptoms ? ?Yesterday reports dizziness, chest tightness, and heaviness. No improvement upon waking, SOB this morning. ?

## 2021-08-06 NOTE — Discharge Instructions (Signed)
Testing today does not show any problems with the heart or lungs.  Your thyroid test was normal.  Continue your current treatments, as well as alternative techniques for stress management and relaxation.  Follow-up with your current providers as needed for problems and is scheduled. ?

## 2021-08-06 NOTE — ED Provider Notes (Signed)
?[facility] DEPT ?Provider Note ? ? ?CSN: [id number] ?Arrival date & time: 08/06/21  1607 ? ?  ? ?History ? ?No chief complaint on file. ? ? ?Pamela Simpson is a 41 y.o. female. ? ?HPI ?She presents for vague symptoms of feeling nervous, having trouble taking deep breath, and a general feeling of ill-ease.  Symptoms worsened last night.  They occurred at rest.  No prior similar problems.  She saw her PCP about a month ago at that time was evaluated for thyroid trouble because of thyromegaly.  She had an ultrasound done which was negative.  She is concerned about family history of thyroid disease, enlargement.  No similar problems in the past.  No known prior cardiac or pulmonary disorders.  No ongoing shortness of breath, cough, focal weakness or paresthesia.  She is here with her husband, a physician/hospitalist.  She works as a Fish farm manager.  She has been prescribed both Xanax and Lexapro for similar symptoms, but has chosen to not take either because she did not like how they made her feel. ?  ? ?Home Medications ?Prior to Admission medications   ?Medication Sig Start Date End Date Taking? Authorizing Provider  ?acetaminophen (TYLENOL) 500 MG tablet Take 2 tablets (1,000 mg total) by mouth every 6 (six) hours. 01/13/20   Thurnell Lose, MD  ?ALPRAZolam Duanne Moron) 0.25 MG tablet Take 1 tablet (0.25 mg total) by mouth daily as needed. 03/11/21     ?amoxicillin-clavulanate (AUGMENTIN) 875-125 MG tablet Take 1 tablet by mouth twice daily 01/17/21   Laurey Morale, MD  ?COVID-19 At Home Antigen Test [facility] COVID-19 HOME TEST) KIT Use as directed within package. 01/13/21   Edmon Crape, RPH  ?escitalopram (LEXAPRO) 10 MG tablet Take 1 tablet (10 mg total) by mouth daily. 07/11/21     ?fluticasone (FLONASE ALLERGY RELIEF) 50 MCG/ACT nasal spray Place 1 spray into both nostrils daily. 03/11/21     ?fluticasone (FLONASE) 50 MCG/ACT nasal spray PLACE 2 SPRAYS INTO EACH  NOSTRIL DAILY 06/01/20 06/01/21  Rometta Emery, PA-C  ?levocetirizine (XYZAL) 5 MG tablet Take 5 mg by mouth every evening.    [provider]  ?pantoprazole (PROTONIX) 40 MG tablet Take 40 mg by mouth daily. 11/27/19   [provider]  ?pantoprazole (PROTONIX) 40 MG tablet TAKE 1 TABLET BY MOUTH ONCE DAILY. 11/26/19 11/25/20  Thurnell Lose, MD  ?pantoprazole (PROTONIX) 40 MG tablet Take 1 tablet (40 mg total) by mouth daily. 03/11/21     ?Prenatal Vit-Fe Fumarate-FA (PRENATAL MULTIVITAMIN) TABS tablet Take 1 tablet by mouth daily at 12 noon.    [provider]  ?   ? ?Allergies    ?Patient has no known allergies.   ? ?Review of Systems   ?Review of Systems ? ?Physical Exam ?Updated Vital Signs ?BP 113/74   Pulse 69   Temp 98 ?F (36.7 ?C) (Oral)   Resp 16   Ht _0  (1.651 m)   Wt 69.4 kg   LMP 07/11/2021   SpO2 99%   BMI 25.46 kg/m?  ?Physical Exam ?Vitals and nursing note reviewed.  ?Constitutional:   ?   Appearance: She is well-developed. She is not ill-appearing.  ?HENT:  ?   Head: Normocephalic and atraumatic.  ?   Right Ear: External ear normal.  ?   Left Ear: External ear normal.  ?   Nose: No congestion.  ?Eyes:  ?   Conjunctiva/sclera: Conjunctivae normal.  ?   Pupils: Pupils  are equal, round, and reactive to light.  ?Neck:  ?   Trachea: Phonation normal.  ?Cardiovascular:  ?   Rate and Rhythm: Normal rate and regular rhythm.  ?   Heart sounds: Normal heart sounds.  ?Pulmonary:  ?   Effort: Pulmonary effort is normal.  ?   Breath sounds: Normal breath sounds.  ?Abdominal:  ?   General: There is no distension.  ?   Palpations: Abdomen is soft.  ?   Tenderness: There is no abdominal tenderness.  ?Musculoskeletal:     ?   General: Normal range of motion.  ?   Cervical back: Normal range of motion and neck supple.  ?Skin: ?   General: Skin is warm and dry.  ?Neurological:  ?   Mental Status: She is alert and oriented to person, place, and time.  ?   Cranial Nerves: No cranial  nerve deficit.  ?   Sensory: No sensory deficit.  ?   Motor: No abnormal muscle tone.  ?   Coordination: Coordination normal.  ?Psychiatric:     ?   Mood and Affect: Mood normal.     ?   Behavior: Behavior normal.     ?   Thought Content: Thought content normal.     ?   Judgment: Judgment normal.  ? ? ?ED Results / Procedures / Treatments   ?Labs ?(all labs ordered are listed, but only abnormal results are displayed) ?Labs Reviewed  ?CBC - Abnormal; Notable for the following components:  ?    Result Value  ? Hemoglobin 11.9 (*)   ? HCT 35.2 (*)   ? All other components within normal limits  ?BASIC METABOLIC PANEL  ?TSH  ?D-DIMER, QUANTITATIVE  ?I-STAT BETA HCG BLOOD, ED ([facility], [facility], [facility] ONLY)  ?TROPONIN I (HIGH SENSITIVITY)  ?TROPONIN I (HIGH SENSITIVITY)  ? ? ?EKG ?EKG Interpretation ? ?Date/Time:  Saturday August 06 2021 08:40:33 EDT ?Ventricular Rate:  77 ?PR Interval:  152 ?QRS Duration: 94 ?QT Interval:  345 ?QTC Calculation: 391 ?R Axis:   55 ?Text Interpretation: Sinus rhythm Abnormal R-wave progression, early transition No old tracing to compare Confirmed by Daleen Bo [phone]) on 08/06/2021 8:48:41 AM ? ?Radiology ?DG Chest 2 View ? ?Result Date: 08/06/2021 ?CLINICAL DATA:  Chest tightness.  Racing heart.  Dizziness. EXAM: CHEST - 2 VIEW COMPARISON:  None. FINDINGS: The heart size and mediastinal contours are within normal limits. Both lungs are clear. The visualized skeletal structures are unremarkable. IMPRESSION: No active cardiopulmonary disease. Electronically Signed   By: Dorise Bullion III M.D.   On: 08/06/2021 09:47   ? ?Procedures ?Procedures  ? ? ?Medications Ordered in ED ?Medications - No data to display ? ?ED Course/ Medical Decision Making/ A&P ?Clinical Course as of 08/06/21 1611  ?Sat Aug 06, 2021  ?1108 She is alert and calm.  Findings discussed with patient as well as her husband who remains at bedside.  Recommend continue current treatment.  Discharge instructions written. [EW]  ?  ?Clinical  Course User Index ?[EW] Daleen Bo, MD  ? ?                        ?Medical Decision Making ?Patient with subacute and recurrent symptoms, which are nonspecific.  No prior diagnosis of anxiety or panic attack.  She was started on alprazolam by her PCP.  She has had recent evaluation of thyroid. ? ?Amount and/or Complexity of Data Reviewed ?Independent Historian:  ?  Details: She is a cogent historian ?External Data Reviewed: notes. ?Labs: ordered. ?   Details: CBC, metabolic panel, TSH, D-dimer, pregnancy test, troponin-normal findings ?Radiology: ordered. ?   Details: Chest x-ray-no infiltrate or edema ?ECG/medicine tests: ordered and independent interpretation performed. ?   Details: Cardiac monitor-normal sinus rhythm ? ?Risk ?Decision regarding hospitalization. ?Risk Details: Patient presenting with nonspecific symptoms, possible related to anxiety or panic attack.  Screening for acute cardiac, pulmonary and thyroid disease, overall negative.  Doubt ACS, pneumonia, acute bacterial infection or metabolic disorders requiring intervention or hospitalization.  She is stable for discharge.  Recommend continue current treatment and medications with follow-up with providers as scheduled. ? ? ? ? ? ? ? ? ? ? ?Final Clinical Impression(s) / ED Diagnoses ?Final diagnoses:  ?Malaise  ?Anxiety  ? ? ?Rx / DC Orders ?ED Discharge Orders   ? ? None  ? ?  ? ? ?  ?Daleen Bo, MD ?08/06/21 1613 ? ?

## 2021-08-09 DIAGNOSIS — F53 Postpartum depression: Secondary | ICD-10-CM | POA: Diagnosis not present

## 2021-08-10 ENCOUNTER — Other Ambulatory Visit (HOSPITAL_COMMUNITY): Payer: Self-pay

## 2021-09-13 ENCOUNTER — Other Ambulatory Visit (HOSPITAL_COMMUNITY): Payer: Self-pay

## 2021-10-10 ENCOUNTER — Other Ambulatory Visit (HOSPITAL_COMMUNITY): Payer: Self-pay

## 2021-11-04 DIAGNOSIS — R109 Unspecified abdominal pain: Secondary | ICD-10-CM | POA: Diagnosis not present

## 2021-11-04 DIAGNOSIS — Z3202 Encounter for pregnancy test, result negative: Secondary | ICD-10-CM | POA: Diagnosis not present

## 2021-11-04 DIAGNOSIS — R1033 Periumbilical pain: Secondary | ICD-10-CM | POA: Diagnosis not present

## 2021-11-07 DIAGNOSIS — R109 Unspecified abdominal pain: Secondary | ICD-10-CM | POA: Diagnosis not present

## 2021-11-14 DIAGNOSIS — R1030 Lower abdominal pain, unspecified: Secondary | ICD-10-CM | POA: Diagnosis not present

## 2021-11-14 DIAGNOSIS — N898 Other specified noninflammatory disorders of vagina: Secondary | ICD-10-CM | POA: Diagnosis not present

## 2021-11-15 DIAGNOSIS — R1030 Lower abdominal pain, unspecified: Secondary | ICD-10-CM | POA: Diagnosis not present

## 2021-11-17 ENCOUNTER — Other Ambulatory Visit (HOSPITAL_COMMUNITY): Payer: Self-pay

## 2021-12-27 ENCOUNTER — Other Ambulatory Visit (HOSPITAL_COMMUNITY): Payer: Self-pay

## 2022-01-05 DIAGNOSIS — R000 Tachycardia, unspecified: Secondary | ICD-10-CM | POA: Diagnosis not present

## 2022-01-05 DIAGNOSIS — R0789 Other chest pain: Secondary | ICD-10-CM | POA: Diagnosis not present

## 2022-01-05 DIAGNOSIS — R079 Chest pain, unspecified: Secondary | ICD-10-CM | POA: Diagnosis not present

## 2022-01-17 ENCOUNTER — Other Ambulatory Visit (HOSPITAL_COMMUNITY): Payer: Self-pay

## 2022-01-17 DIAGNOSIS — R0789 Other chest pain: Secondary | ICD-10-CM | POA: Diagnosis not present

## 2022-01-17 DIAGNOSIS — F419 Anxiety disorder, unspecified: Secondary | ICD-10-CM | POA: Diagnosis not present

## 2022-01-17 MED ORDER — ESCITALOPRAM OXALATE 20 MG PO TABS
20.0000 mg | ORAL_TABLET | Freq: Every day | ORAL | 3 refills | Status: DC
  Filled 2022-01-17: qty 90, 90d supply, fill #0

## 2022-01-18 DIAGNOSIS — R079 Chest pain, unspecified: Secondary | ICD-10-CM | POA: Diagnosis not present

## 2022-01-19 DIAGNOSIS — R079 Chest pain, unspecified: Secondary | ICD-10-CM | POA: Diagnosis not present

## 2022-02-08 DIAGNOSIS — N839 Noninflammatory disorder of ovary, fallopian tube and broad ligament, unspecified: Secondary | ICD-10-CM | POA: Diagnosis not present

## 2022-02-21 ENCOUNTER — Other Ambulatory Visit: Payer: Self-pay | Admitting: Internal Medicine

## 2022-02-21 DIAGNOSIS — K219 Gastro-esophageal reflux disease without esophagitis: Secondary | ICD-10-CM | POA: Diagnosis not present

## 2022-02-21 DIAGNOSIS — N838 Other noninflammatory disorders of ovary, fallopian tube and broad ligament: Secondary | ICD-10-CM | POA: Diagnosis not present

## 2022-02-21 DIAGNOSIS — R1310 Dysphagia, unspecified: Secondary | ICD-10-CM

## 2022-02-21 DIAGNOSIS — E559 Vitamin D deficiency, unspecified: Secondary | ICD-10-CM | POA: Diagnosis not present

## 2022-02-21 DIAGNOSIS — R5383 Other fatigue: Secondary | ICD-10-CM | POA: Diagnosis not present

## 2022-02-21 DIAGNOSIS — D509 Iron deficiency anemia, unspecified: Secondary | ICD-10-CM | POA: Diagnosis not present

## 2022-02-21 DIAGNOSIS — K5900 Constipation, unspecified: Secondary | ICD-10-CM | POA: Diagnosis not present

## 2022-02-21 DIAGNOSIS — N951 Menopausal and female climacteric states: Secondary | ICD-10-CM | POA: Diagnosis not present

## 2022-02-22 ENCOUNTER — Ambulatory Visit
Admission: RE | Admit: 2022-02-22 | Discharge: 2022-02-22 | Disposition: A | Discharge status: Home / Self Care | Payer: 59 | Source: Ambulatory Visit | Attending: Internal Medicine | Admitting: Internal Medicine

## 2022-02-22 DIAGNOSIS — K449 Diaphragmatic hernia without obstruction or gangrene: Secondary | ICD-10-CM | POA: Diagnosis not present

## 2022-02-22 DIAGNOSIS — R1310 Dysphagia, unspecified: Secondary | ICD-10-CM

## 2022-02-22 DIAGNOSIS — K219 Gastro-esophageal reflux disease without esophagitis: Secondary | ICD-10-CM

## 2022-04-21 ENCOUNTER — Other Ambulatory Visit (HOSPITAL_COMMUNITY): Payer: Self-pay

## 2022-04-21 DIAGNOSIS — K219 Gastro-esophageal reflux disease without esophagitis: Secondary | ICD-10-CM | POA: Diagnosis not present

## 2022-04-21 DIAGNOSIS — Z13220 Encounter for screening for lipoid disorders: Secondary | ICD-10-CM | POA: Diagnosis not present

## 2022-04-21 DIAGNOSIS — Z0000 Encounter for general adult medical examination without abnormal findings: Secondary | ICD-10-CM | POA: Diagnosis not present

## 2022-04-21 MED ORDER — PANTOPRAZOLE SODIUM 40 MG PO TBEC
40.0000 mg | DELAYED_RELEASE_TABLET | Freq: Every day | ORAL | 3 refills | Status: DC
  Filled 2022-04-21: qty 90, 90d supply, fill #0
  Filled 2022-10-12: qty 30, 30d supply, fill #1
  Filled 2022-12-11: qty 30, 30d supply, fill #2
  Filled 2023-02-04: qty 30, 30d supply, fill #3

## 2022-05-03 ENCOUNTER — Other Ambulatory Visit: Payer: 59

## 2022-05-03 ENCOUNTER — Other Ambulatory Visit: Payer: Self-pay | Admitting: Adult Health

## 2022-05-03 DIAGNOSIS — J029 Acute pharyngitis, unspecified: Secondary | ICD-10-CM

## 2022-05-05 LAB — CULTURE, GROUP A STREP
MICRO NUMBER:: 14310035
SPECIMEN QUALITY:: ADEQUATE

## 2022-05-25 ENCOUNTER — Other Ambulatory Visit: Payer: Self-pay | Admitting: Adult Health

## 2022-05-25 ENCOUNTER — Other Ambulatory Visit (HOSPITAL_COMMUNITY): Payer: Self-pay

## 2022-05-25 MED ORDER — AZITHROMYCIN 250 MG PO TABS
ORAL_TABLET | ORAL | 0 refills | Status: AC
  Filled 2022-05-25: qty 6, 5d supply, fill #0

## 2022-06-16 DIAGNOSIS — F3281 Premenstrual dysphoric disorder: Secondary | ICD-10-CM | POA: Diagnosis not present

## 2022-06-16 DIAGNOSIS — R5383 Other fatigue: Secondary | ICD-10-CM | POA: Diagnosis not present

## 2022-06-16 DIAGNOSIS — N951 Menopausal and female climacteric states: Secondary | ICD-10-CM | POA: Diagnosis not present

## 2022-06-16 DIAGNOSIS — R0789 Other chest pain: Secondary | ICD-10-CM | POA: Diagnosis not present

## 2022-09-18 ENCOUNTER — Other Ambulatory Visit (HOSPITAL_COMMUNITY): Payer: Self-pay

## 2022-09-18 MED ORDER — LO LOESTRIN FE 1 MG-10 MCG / 10 MCG PO TABS
1.0000 | ORAL_TABLET | Freq: Every day | ORAL | 4 refills | Status: DC
  Filled 2022-09-18: qty 112, 104d supply, fill #0

## 2022-09-22 IMAGING — CR DG CHEST 2V
2 series · 2 of 2 positions shown · non-contrast
Comparison: None.

CLINICAL DATA: Chest tightness.  Racing heart.  Dizziness.

EXAM:
CHEST - 2 VIEW

[w chest pa]
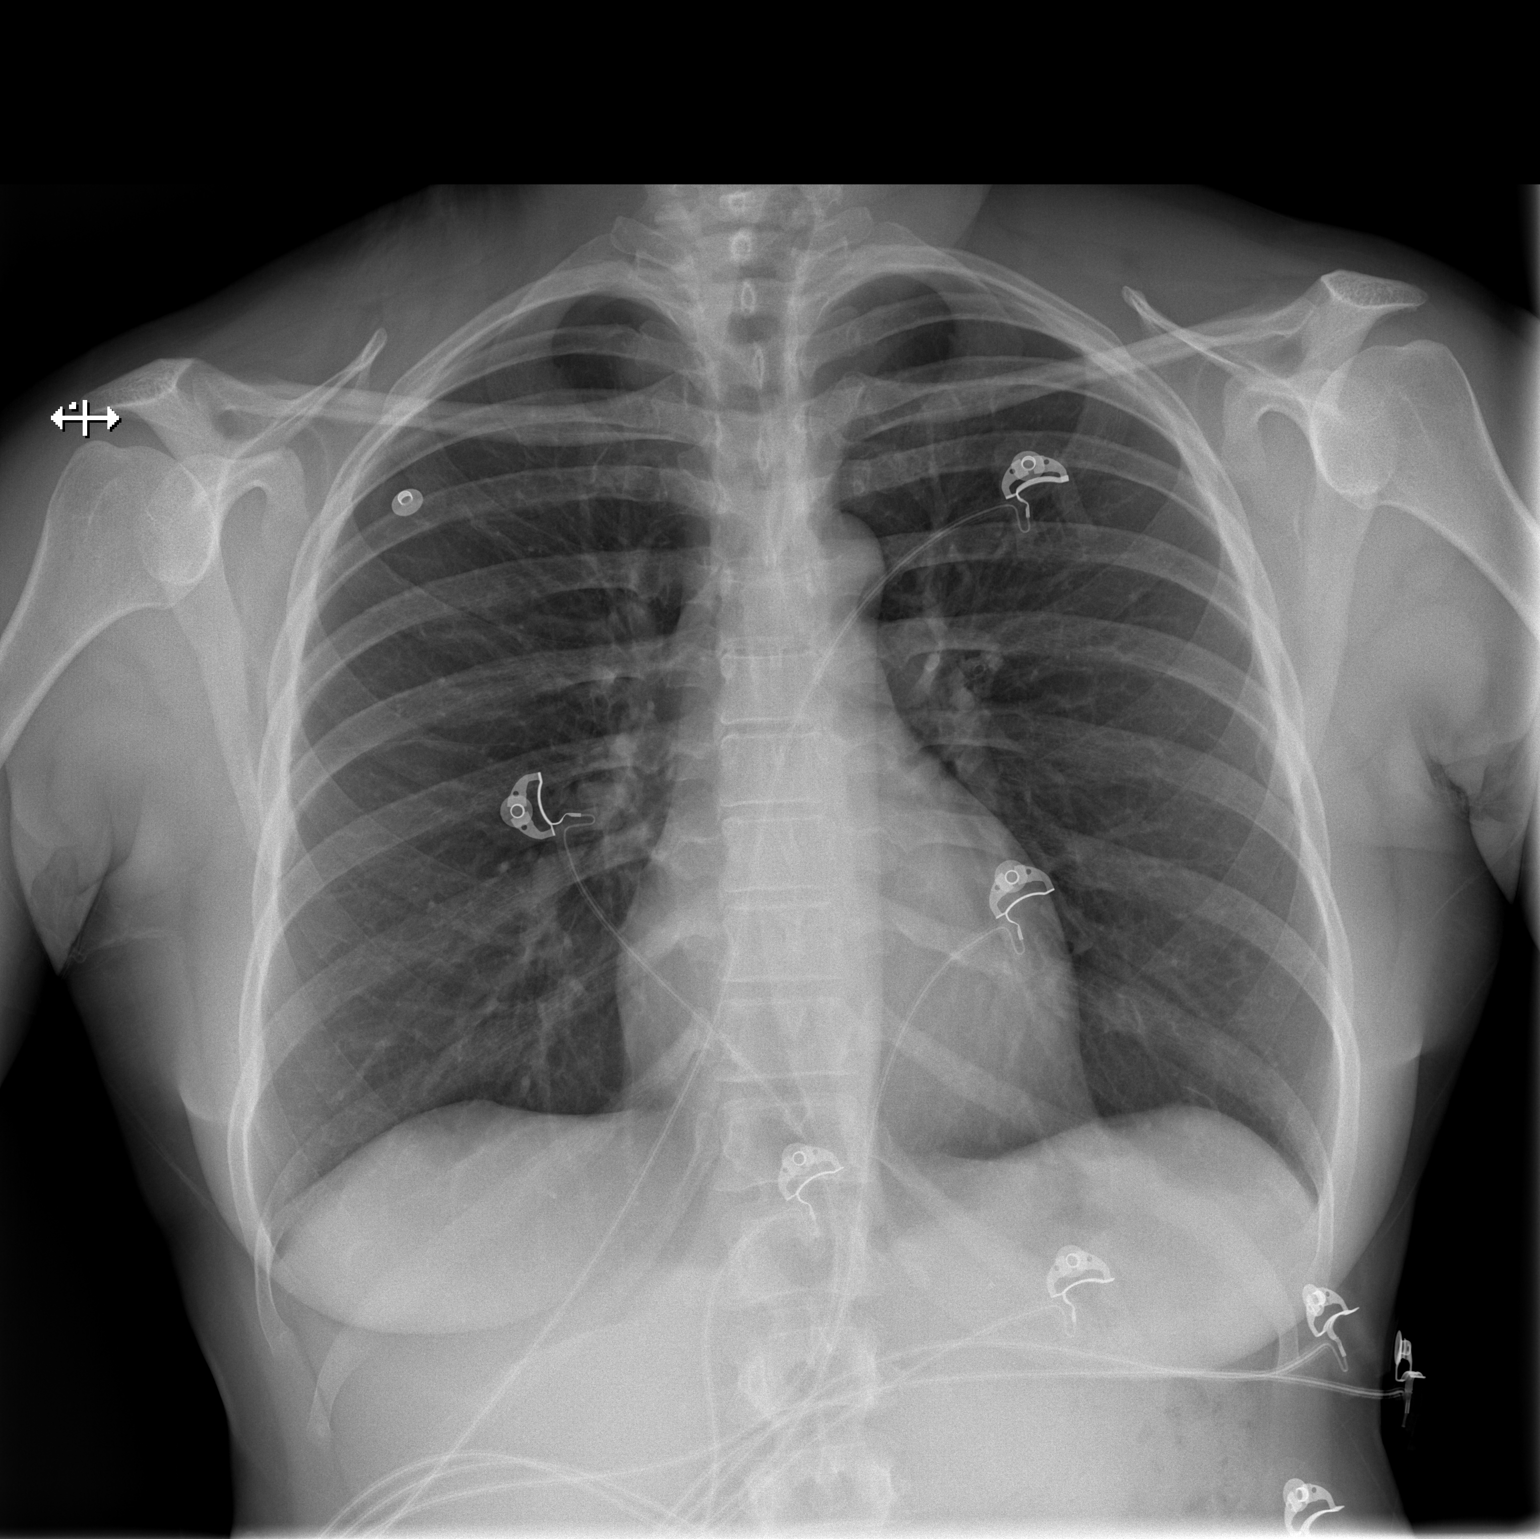

[w chest lat]
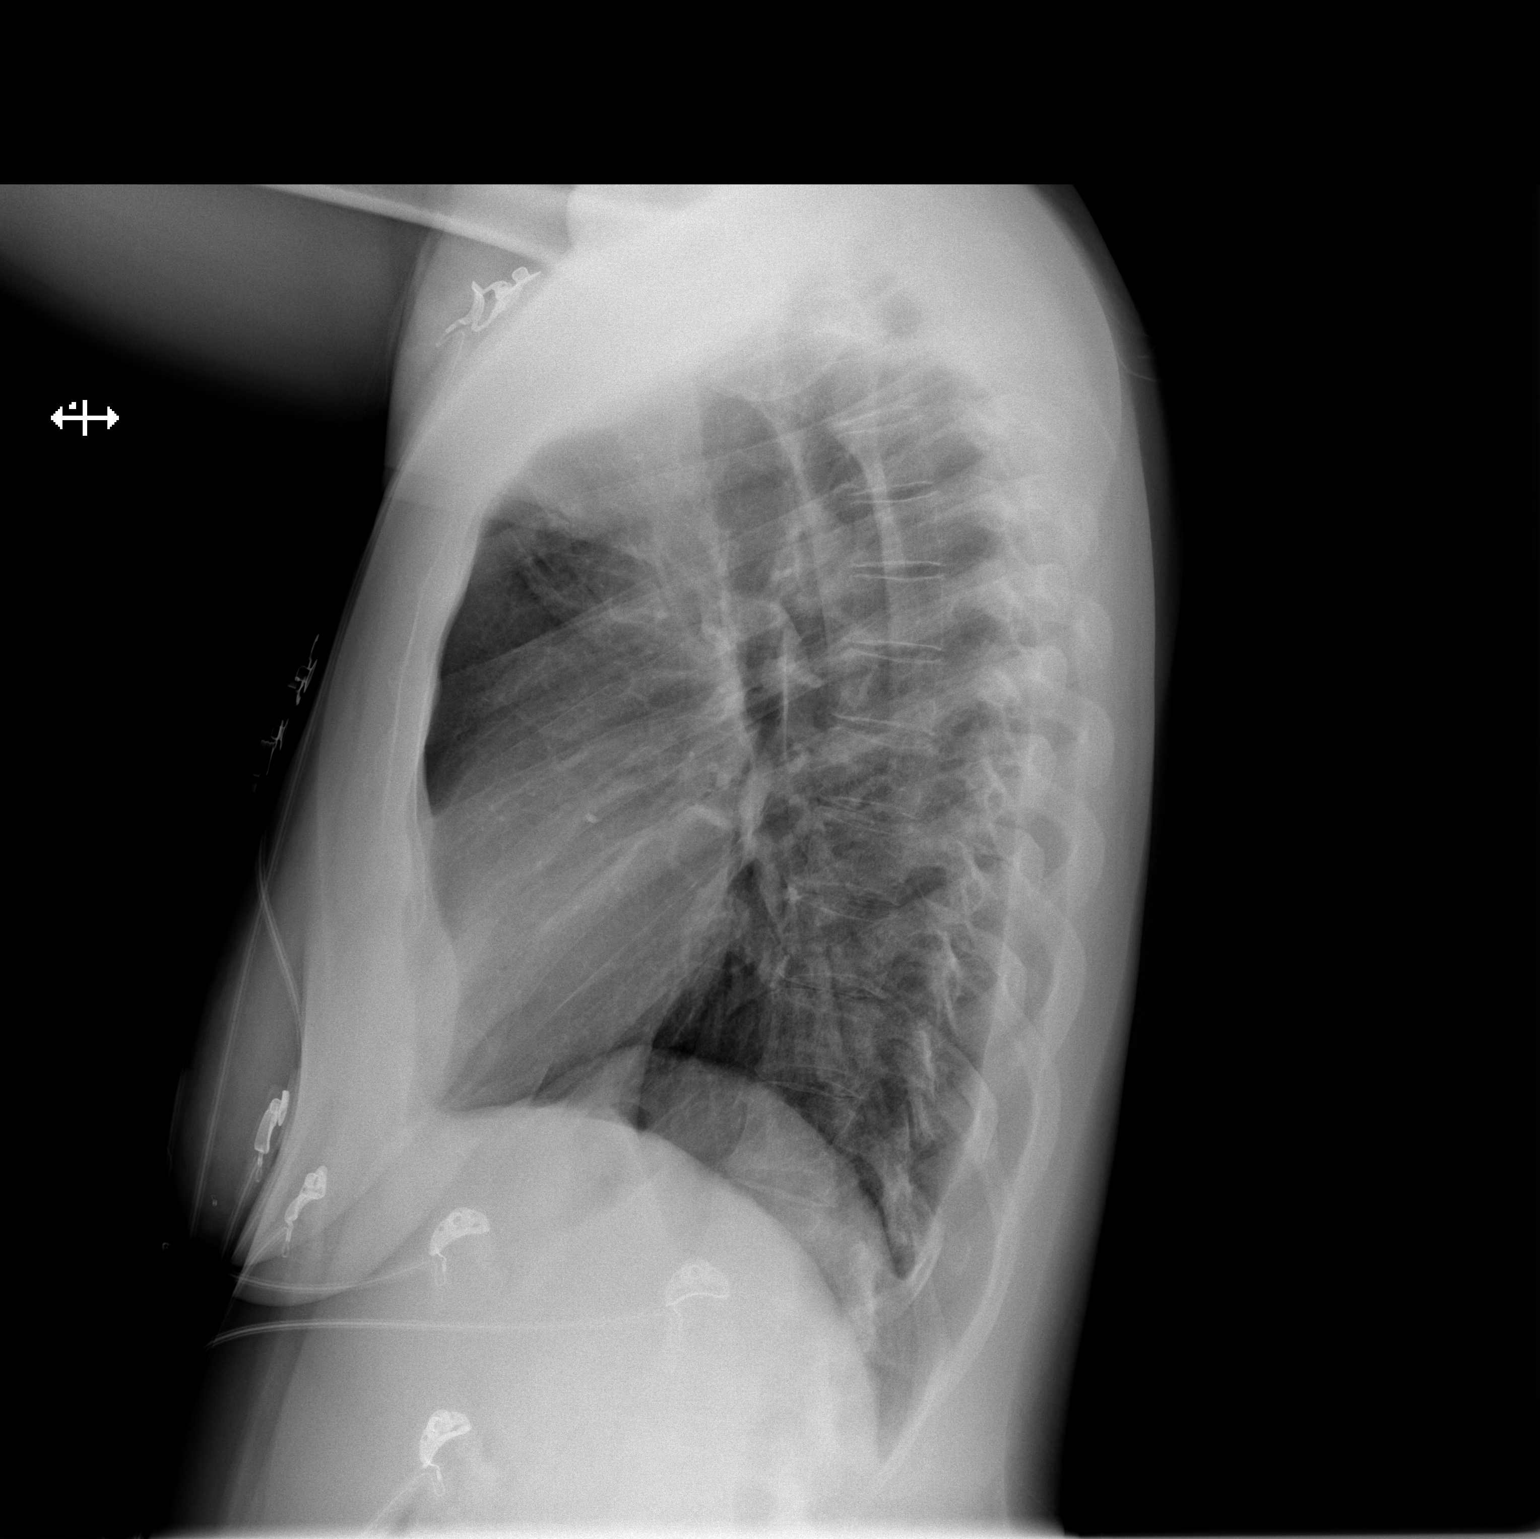

[2 of 2 positions shown; findings below may reference images not displayed]

FINDINGS: The heart size and mediastinal contours are within normal limits.
Both lungs are clear. The visualized skeletal structures are
unremarkable.
IMPRESSION: No active cardiopulmonary disease.

## 2022-09-26 ENCOUNTER — Other Ambulatory Visit: Payer: Self-pay | Admitting: Adult Health

## 2022-09-26 ENCOUNTER — Other Ambulatory Visit (HOSPITAL_COMMUNITY): Payer: Self-pay

## 2022-09-26 MED ORDER — AZITHROMYCIN 250 MG PO TABS
ORAL_TABLET | ORAL | 0 refills | Status: AC
  Filled 2022-09-26: qty 6, 5d supply, fill #0

## 2022-10-12 ENCOUNTER — Other Ambulatory Visit (HOSPITAL_COMMUNITY): Payer: Self-pay

## 2022-10-12 MED ORDER — ALPRAZOLAM 0.25 MG PO TABS
0.2500 mg | ORAL_TABLET | Freq: Every day | ORAL | 1 refills | Status: DC | PRN
  Filled 2022-10-12: qty 30, 30d supply, fill #0

## 2022-11-06 NOTE — Progress Notes (Unsigned)
11/08/2022 Pamela Simpson [id number] Mar 04, 1981  Referring provider: Renford Dills, MD Primary GI doctor: Dr. Meridee Score  ASSESSMENT AND PLAN:   GERD with dysphagia, epigastric pain x years, worse last 3-4 months Barium swallow with GERD/HH, no stricture Check Ab [place] with dyspepsia, check CBC, CMET, lipase Increase protonix to twice a day Will schedule EGD at [facility], rule out gastritis, H pylori, EOE, esophagitis.  I discussed risks of EGD with patient today, including risk of sedation, bleeding or perforation.  Patient provides understanding and gave verbal consent to proceed.  Chronic idiopathic constipation No change in bowel habits, has had constipation for a lot time No hemtochezia, weight loss, lower AB pain - Increase fiber/ water intake, decrease caffeine, increase activity level. -Will add on Miralax daily and Benefiber  Anemia, unspecified type 07/2021 with normocytic anemia, was having heavy menses at that time Has been on BCP with improved menses Recheck Cbc, iron/ferritin If IDA, will add on colonoscopy.    Patient Care Team: Renford Dills, MD as PCP - General (Internal Medicine)  HISTORY OF PRESENT ILLNESS: 42 y.o. female with a past medical history of GERD, vitamin D def, anxiety, dysmenorrhea and others listed below presents for evaluation of abdominal pain.  Patient presented to primary care Dr. Nehemiah Settle 02/2022 with reflux and dysphagia.  Patient's on Protonix 40 mg once daily since 2021, Loestrin and Flonase. 02/22/2022 barium swallow with pill shows minimal reducible hiatal hernia without evidence of spontaneous or induced GERD, no evidence of stricture or ulceration.  She states she has been having AB pain for about 3 years but for the last 3-4 months has had constant AB pain.  She is on protonix 40 mg daily.  She has sharp, burning pain and can take tums in the morning or night without help.  She states can have odynophagia with swallowing and  can be slow moving down, after food passes it is better.  She has been stressed with her job.  She was having heavy menses but was put on BCP 4 months ago with decrease in her symptoms.  Patient reports family history of father with prostate cancer, no family history of colon cancer but her dad had something with gastroenterology, will check and call back.  She denies blood thinner use.  She denies NSAID use or very rare.  She reports ETOH use, rare social use.    She denies tobacco use.  She denies drug use.    She  reports that she has never smoked. She has never used smokeless tobacco. She reports current alcohol use. She reports that she does not use drugs.  RELEVANT LABS AND IMAGING: CBC    Component Value Date/Time   WBC 5.2 08/06/2021 0900   RBC 4.39 08/06/2021 0900   HGB 11.9 (L) 08/06/2021 0900   HCT 35.2 (L) 08/06/2021 0900   PLT 356 08/06/2021 0900   MCV 80.2 08/06/2021 0900   MCH 27.1 08/06/2021 0900   MCHC 33.8 08/06/2021 0900   RDW 14.6 08/06/2021 0900   LYMPHSABS 1.6 01/22/2019 1053   MONOABS 0.3 01/22/2019 1053   EOSABS 0.1 01/22/2019 1053   BASOSABS 0.0 01/22/2019 1053   No results for input(s): "HGB" in the last 8760 hours.  CMP     Component Value Date/Time   NA 135 08/06/2021 0900   K 3.8 08/06/2021 0900   CL 102 08/06/2021 0900   CO2 26 08/06/2021 0900   GLUCOSE 99 08/06/2021 0900   BUN 13 08/06/2021 0900  CREATININE 0.76 08/06/2021 0900   CALCIUM 9.1 08/06/2021 0900   PROT 7.2 01/22/2019 1053   ALBUMIN 4.4 01/22/2019 1053   AST 11 01/22/2019 1053   ALT 11 01/22/2019 1053   ALKPHOS 37 (L) 01/22/2019 1053   BILITOT 0.4 01/22/2019 1053   GFRNONAA >60 08/06/2021 0900      Latest Ref Rng & Units 01/22/2019   10:53 AM  Hepatic Function  Total Protein 6.0 - 8.3 g/dL 7.2   Albumin 3.5 - 5.2 g/dL 4.4   AST 0 - 37 U/L 11   ALT 0 - 35 U/L 11   Alk Phosphatase 39 - 117 U/L 37   Total Bilirubin 0.2 - 1.2 mg/dL 0.4       Current Medications:    Current Outpatient Medications (Endocrine & Metabolic):    Norethindrone-Ethinyl Estradiol-Fe Biphas (LO LOESTRIN FE) 1 MG-10 MCG / 10 MCG tablet, Take 1 tablet by mouth daily. May skip placebo pills   Current Outpatient Medications (Respiratory):    fluticasone (FLONASE ALLERGY RELIEF) 50 MCG/ACT nasal spray, Place 1 spray into both nostrils daily.   levocetirizine (XYZAL) 5 MG tablet, Take 5 mg by mouth every evening.   fluticasone (FLONASE) 50 MCG/ACT nasal spray, PLACE 2 SPRAYS INTO EACH NOSTRIL DAILY  Current Outpatient Medications (Analgesics):    acetaminophen (TYLENOL) 500 MG tablet, Take 2 tablets (1,000 mg total) by mouth every 6 (six) hours.   Current Outpatient Medications (Other):    pantoprazole (PROTONIX) 40 MG tablet, Take 40 mg by mouth daily.   pantoprazole (PROTONIX) 40 MG tablet, Take 1 tablet (40 mg total) by mouth daily.   pantoprazole (PROTONIX) 40 MG tablet, Take 1 tablet (40 mg total) by mouth daily.   ALPRAZolam (XANAX) 0.25 MG tablet, Take 1 tablet (0.25 mg total) by mouth daily as needed. (Patient not taking: Reported on 11/08/2022)   ALPRAZolam (XANAX) 0.25 MG tablet, Take 1 tablet (0.25 mg total) by mouth daily as needed. (Patient not taking: Reported on 11/08/2022)   amoxicillin-clavulanate (AUGMENTIN) 875-125 MG tablet, Take 1 tablet by mouth twice daily (Patient not taking: Reported on 11/08/2022)   COVID-19 At Home Antigen Test ([facility] COVID-19 HOME TEST) KIT, Use as directed within package. (Patient not taking: Reported on 11/08/2022)   escitalopram (LEXAPRO) 10 MG tablet, Take 1 tablet (10 mg total) by mouth daily. (Patient not taking: Reported on 11/08/2022)   escitalopram (LEXAPRO) 20 MG tablet, Take 1 tablet (20 mg total) by mouth daily. (Patient not taking: Reported on 11/08/2022)   pantoprazole (PROTONIX) 40 MG tablet, TAKE 1 TABLET BY MOUTH ONCE DAILY.   Prenatal Vit-Fe Fumarate-FA (PRENATAL MULTIVITAMIN) TABS tablet, Take 1 tablet by mouth daily at  12 noon. (Patient not taking: Reported on 11/08/2022)  Medical History:  Past Medical History:  Diagnosis Date   GERD (gastroesophageal reflux disease)    Allergies: No Known Allergies   Surgical History:  She  has a past surgical history that includes Lipoma excision and Cesarean section (N/A, 01/10/2020). Family History:  Her family history includes Diabetes in her father; Healthy in her sister; Heart attack in her mother; Stroke in her father.  REVIEW OF SYSTEMS  : Review of Systems  Constitutional:  Negative for chills, fever and weight loss.  HENT:  Negative for sinus pain and sore throat.   Respiratory:  Negative for shortness of breath.   Cardiovascular:  Negative for chest pain and leg swelling.  Gastrointestinal:  Positive for abdominal pain, constipation and heartburn. Negative for blood in stool,  diarrhea, melena, nausea and vomiting.  Neurological:  Negative for weakness.    PHYSICAL EXAM: BP 130/76   Pulse 81   Ht 5\' 5"  (1.651 m)   Wt 172 lb (78 kg)   BMI 28.62 kg/m  General Appearance: Well nourished, in no apparent distress. Head:   Normocephalic and atraumatic. Eyes:  sclerae anicteric,conjunctive pink  Respiratory: Respiratory effort normal, BS equal bilaterally without rales, rhonchi, wheezing. Cardio: RRR with no MRGs. Peripheral pulses intact.  Abdomen: Soft,  Obese ,active bowel sounds. mild tenderness in the epigastrium and in the RUQ. Without guarding and Without rebound. No masses. Rectal: Not evaluated Musculoskeletal: Full ROM, Normal gait. Without edema. Skin:  Dry and intact without significant lesions or rashes Neuro: Alert and  oriented x4;  No focal deficits. Psych:  Cooperative. Normal mood and affect.    Doree Albee, PA-C 9:33 AM

## 2022-11-08 ENCOUNTER — Other Ambulatory Visit (INDEPENDENT_AMBULATORY_CARE_PROVIDER_SITE_OTHER): Payer: 59

## 2022-11-08 ENCOUNTER — Telehealth: Payer: Self-pay | Admitting: Physician Assistant

## 2022-11-08 ENCOUNTER — Ambulatory Visit: Payer: 59 | Admitting: Physician Assistant

## 2022-11-08 ENCOUNTER — Encounter: Payer: Self-pay | Admitting: Physician Assistant

## 2022-11-08 VITALS — BP 130/76 | HR 81 | Ht 65.0 in | Wt 172.0 lb

## 2022-11-08 DIAGNOSIS — K5904 Chronic idiopathic constipation: Secondary | ICD-10-CM | POA: Diagnosis not present

## 2022-11-08 DIAGNOSIS — K2100 Gastro-esophageal reflux disease with esophagitis, without bleeding: Secondary | ICD-10-CM

## 2022-11-08 DIAGNOSIS — R1319 Other dysphagia: Secondary | ICD-10-CM | POA: Diagnosis not present

## 2022-11-08 DIAGNOSIS — R1013 Epigastric pain: Secondary | ICD-10-CM | POA: Diagnosis not present

## 2022-11-08 DIAGNOSIS — D649 Anemia, unspecified: Secondary | ICD-10-CM

## 2022-11-08 LAB — COMPREHENSIVE METABOLIC PANEL
ALT: 10 U/L (ref 0–35)
AST: 9 U/L (ref 0–37)
Albumin: 4.2 g/dL (ref 3.5–5.2)
Alkaline Phosphatase: 26 U/L — ABNORMAL LOW (ref 39–117)
BUN: 12 mg/dL (ref 6–23)
CO2: 30 mEq/L (ref 19–32)
Calcium: 9.5 mg/dL (ref 8.4–10.5)
Chloride: 102 mEq/L (ref 96–112)
Creatinine, Ser: 0.84 mg/dL (ref 0.40–1.20)
GFR: 87.29 mL/min (ref >60.00)
Glucose, Bld: 81 mg/dL (ref 70–99)
Potassium: 3.9 mEq/L (ref 3.5–5.1)
Sodium: 139 mEq/L (ref 135–145)
Total Bilirubin: 0.3 mg/dL (ref 0.2–1.2)
Total Protein: 7.4 g/dL (ref 6.0–8.3)

## 2022-11-08 LAB — IBC + FERRITIN
Ferritin: 29.6 ng/mL (ref 10.0–291.0)
Iron: 90 ug/dL (ref 42–145)
Saturation Ratios: 22.4 % (ref 20.0–50.0)
TIBC: 401.8 ug/dL (ref 250.0–450.0)
Transferrin: 287 mg/dL (ref 212.0–360.0)

## 2022-11-08 LAB — LIPASE: Lipase: 19 U/L (ref 11.0–59.0)

## 2022-11-08 LAB — CBC WITH DIFFERENTIAL/PLATELET
Basophils Absolute: 0 10*3/uL (ref 0.0–0.1)
Basophils Relative: 0.3 % (ref 0.0–3.0)
Eosinophils Absolute: 0.1 10*3/uL (ref 0.0–0.7)
Eosinophils Relative: 1.4 % (ref 0.0–5.0)
HCT: 38.6 % (ref 36.0–46.0)
Hemoglobin: 12.6 g/dL (ref 12.0–15.0)
Lymphocytes Relative: 26.8 % (ref 12.0–46.0)
Lymphs Abs: 1.3 10*3/uL (ref 0.7–4.0)
MCHC: 32.8 g/dL (ref 30.0–36.0)
MCV: 79 fl (ref 78.0–100.0)
Monocytes Absolute: 0.3 10*3/uL (ref 0.1–1.0)
Monocytes Relative: 6.6 % (ref 3.0–12.0)
Neutro Abs: 3.3 10*3/uL (ref 1.4–7.7)
Neutrophils Relative %: 64.9 % (ref 43.0–77.0)
Platelets: 423 10*3/uL — ABNORMAL HIGH (ref 150.0–400.0)
RBC: 4.88 Mil/uL (ref 3.87–5.11)
RDW: 14.4 % (ref 11.5–15.5)
WBC: 5 10*3/uL (ref 4.0–10.5)

## 2022-11-08 NOTE — Telephone Encounter (Signed)
PT was seen in office today and received a MyChart message about an US of the abdomen and it was not something that was discussed. She wants to confirm if this is something she has to do or not

## 2022-11-08 NOTE — Progress Notes (Signed)
Attending Physician's Attestation   I have reviewed the chart.   I agree with the Advanced Practitioner's note, impression, and recommendations with any updates as below.    Leiyah Maultsby Mansouraty, MD [facility] Office # [phone]  

## 2022-11-08 NOTE — Telephone Encounter (Signed)
Confirmed [place] appt with PT

## 2022-11-08 NOTE — Patient Instructions (Addendum)
Your provider has requested that you go to the basement level for lab work before leaving today. Press "B" on the elevator. The lab is located at the first door on the left as you exit the elevator.   You have been scheduled for an endoscopy. Please follow written instructions given to you at your visit today. If you use inhalers (even only as needed), please bring them with you on the day of your procedure.   Please take your proton pump inhibitor medication, INCREASE PROTONIX TO TWICE A DAY  Please take this medication 30 minutes to 1 hour before meals- this makes it more effective.  Avoid spicy and acidic foods Avoid fatty foods Limit your intake of coffee, tea, alcohol, and carbonated drinks Work to maintain a healthy weight Keep the head of the bed elevated at least 3 inches with blocks or a wedge pillow if you are having any nighttime symptoms Stay upright for 2 hours after eating Avoid meals and snacks three to four hours before bedtime  Miralax is an osmotic laxative.  It only brings more water into the stool.  This is safe to take daily.  Can take up to 17 gram of miralax twice a day.  Mix with juice or coffee.  Start 1 capful at night for 3-4 days and reassess your response in 3-4 days.  You can increase and decrease the dose based on your response.  Remember, it can take up to 3-4 days to take effect OR for the effects to wear off.   I often pair this with benefiber in the morning to help assure the stool is not too loose.   Toileting tips to help with your constipation - Drink at least 64-80 ounces of water/liquid per day. - Establish a time to try to move your bowels every day.  For many people, this is after a cup of coffee or after a meal such as breakfast. - Sit all of the way back on the toilet keeping your back fairly straight and while sitting up, try to rest the tops of your forearms on your upper thighs.   - Raising your feet with a step stool/squatty potty can be  helpful to improve the angle that allows your stool to pass through the rectum. - Relax the rectum feeling it bulge toward the toilet water.  If you feel your rectum raising toward your body, you are contracting rather than relaxing. - Breathe in and slowly exhale. "Belly breath" by expanding your belly towards your belly button. Keep belly expanded as you gently direct pressure down and back to the anus.  A low pitched GRRR sound can assist with increasing intra-abdominal pressure.  (Can also trying to blow on a pinwheel and make it move, this helps with the same belly breathing) - Repeat 3-4 times. If unsuccessful, contract the pelvic floor to restore normal tone and get off the toilet.  Avoid excessive straining. - To reduce excessive wiping by teaching your anus to normally contract, place hands on outer aspect of knees and resist knee movement outward.  Hold 5-10 second then place hands just inside of knees and resist inward movement of knees.  Hold 5 seconds.  Repeat a few times each way.  Go to the ER if unable to pass gas, severe AB pain, unable to hold down food, any shortness of breath of chest pain. You have been scheduled for an abdominal ultrasound at [facility] (1st floor of hospital) on 11/13/2022 at 7:00am. Please  arrive 30 minutes prior to your appointment for registration. Make certain not to have anything to eat or drink 6 hours prior to your appointment. Should you need to reschedule your appointment, please contact radiology at [phone]. This test typically takes about 30 minutes to perform.  _______________________________________________________  If your blood pressure at your visit was 140/90 or greater, please contact your primary care physician to follow up on this.  _______________________________________________________  If you are age 73 or older, your body mass index should be between 23-30. Your Body mass index is 28.62 kg/m. If this is out of the  aforementioned range listed, please consider follow up with your Primary Care Provider.  If you are age 73 or younger, your body mass index should be between 19-25. Your Body mass index is 28.62 kg/m. If this is out of the aformentioned range listed, please consider follow up with your Primary Care Provider.   ________________________________________________________  The [facility] providers would like to encourage you to use [facility] to communicate with providers for non-urgent requests or questions.  Due to long hold times on the telephone, sending your provider a message by [facility] may be a faster and more efficient way to get a response.  Please allow 48 business hours for a response.  Please remember that this is for non-urgent requests.  _______________________________________________________ It was a pleasure to see you today!  Thank you for trusting me with your gastrointestinal care!

## 2022-11-13 ENCOUNTER — Ambulatory Visit (HOSPITAL_COMMUNITY)
Admission: RE | Admit: 2022-11-13 | Discharge: 2022-11-13 | Disposition: A | Discharge status: Home / Self Care | Payer: 59 | Source: Ambulatory Visit | Attending: Physician Assistant | Admitting: Physician Assistant

## 2022-11-13 DIAGNOSIS — R1013 Epigastric pain: Secondary | ICD-10-CM | POA: Diagnosis present

## 2022-11-13 DIAGNOSIS — K2100 Gastro-esophageal reflux disease with esophagitis, without bleeding: Secondary | ICD-10-CM | POA: Diagnosis present

## 2022-11-16 ENCOUNTER — Other Ambulatory Visit (HOSPITAL_COMMUNITY): Payer: Self-pay

## 2022-11-16 MED ORDER — LO LOESTRIN FE 1 MG-10 MCG / 10 MCG PO TABS
1.0000 | ORAL_TABLET | Freq: Every day | ORAL | 4 refills | Status: DC
  Filled 2022-11-16: qty 28, 28d supply, fill #0

## 2022-11-16 MED ORDER — SERTRALINE HCL 25 MG PO TABS
25.0000 mg | ORAL_TABLET | Freq: Every day | ORAL | 0 refills | Status: DC
  Filled 2022-11-16: qty 30, 30d supply, fill #0

## 2022-11-29 ENCOUNTER — Encounter: Payer: Self-pay | Admitting: Gastroenterology

## 2022-11-29 ENCOUNTER — Ambulatory Visit (AMBULATORY_SURGERY_CENTER): Payer: 59 | Admitting: Gastroenterology

## 2022-11-29 VITALS — BP 102/65 | HR 90 | Temp 98.0°F | Resp 13 | Ht 65.0 in | Wt 172.0 lb

## 2022-11-29 DIAGNOSIS — K222 Esophageal obstruction: Secondary | ICD-10-CM

## 2022-11-29 DIAGNOSIS — K2970 Gastritis, unspecified, without bleeding: Secondary | ICD-10-CM

## 2022-11-29 DIAGNOSIS — R1319 Other dysphagia: Secondary | ICD-10-CM

## 2022-11-29 MED ORDER — SODIUM CHLORIDE 0.9 % IV SOLN: 500.0000 mL | INTRAVENOUS | Status: DC

## 2022-11-29 MED ORDER — DEXTROSE 5 % IV SOLN: INTRAVENOUS | Status: AC

## 2022-11-29 NOTE — Patient Instructions (Signed)
YOU HAD AN ENDOSCOPIC PROCEDURE TODAY AT THE  ENDOSCOPY CENTER:   Refer to the procedure report that was given to you for any specific questions about what was found during the examination.  If the procedure report does not answer your questions, please call your gastroenterologist to clarify.  If you requested that your care partner not be given the details of your procedure findings, then the procedure report has been included in a sealed envelope for you to review at your convenience later.  YOU SHOULD EXPECT: Some feelings of bloating in the abdomen. Passage of more gas than usual.  Walking can help get rid of the air that was put into your GI tract during the procedure and reduce the bloating. If you had a lower endoscopy (such as a colonoscopy or flexible sigmoidoscopy) you may notice spotting of blood in your stool or on the toilet paper. If you underwent a bowel prep for your procedure, you may not have a normal bowel movement for a few days.  Please Note:  You might notice some irritation and congestion in your nose or some drainage.  This is from the oxygen used during your procedure.  There is no need for concern and it should clear up in a day or so.  SYMPTOMS TO REPORT IMMEDIATELY:  Following upper endoscopy (EGD)  Vomiting of blood or coffee ground material  New chest pain or pain under the shoulder blades  Painful or persistently difficult swallowing  New shortness of breath  Fever of 100F or higher  Black, tarry-looking stools  For urgent or emergent issues, a gastroenterologist can be reached at any hour by calling [phone]. Do not use MyChart messaging for urgent concerns.    DIET:  Dilation diet today (see handout), but then you may proceed to your regular diet tomorrow.  Drink plenty of fluids but you should avoid alcoholic beverages for 24 hours.  ACTIVITY:  You should plan to take it easy for the rest of today and you should NOT DRIVE or use heavy machinery  until tomorrow (because of the sedation medicines used during the test).    FOLLOW UP: Our staff will call the number listed on your records the next business day following your procedure.  We will call around 7:15- 8:00 am to check on you and address any questions or concerns that you may have regarding the information given to you following your procedure. If we do not reach you, we will leave a message.     If any biopsies were taken you will be contacted by phone or by letter within the next 1-3 weeks.  Please call us at [phone] if you have not heard about the biopsies in 3 weeks.    SIGNATURES/CONFIDENTIALITY: You and/or your care partner have signed paperwork which will be entered into your electronic medical record.  These signatures attest to the fact that that the information above on your After Visit Summary has been reviewed and is understood.  Full responsibility of the confidentiality of this discharge information lies with you and/or your care-partner. 

## 2022-11-29 NOTE — Progress Notes (Signed)
Sedate, gd SR, tolerated procedure well, VSS, report to RN 

## 2022-11-29 NOTE — Op Note (Signed)
[facility] Patient Name: Pamela Simpson Procedure Date: 11/29/2022 1:18 PM MRN: [id number] Endoscopist: Corliss Parish , MD, [id number] Age: 42 Referring MD:  Date of Birth: 02/08/1981 Gender: Female Account #: [id number] Procedure:                Upper GI endoscopy Indications:              Epigastric abdominal pain, Dysphagia, Heartburn Medicines:                Monitored Anesthesia Care Procedure:                Pre-Anesthesia Assessment:                           - Prior to the procedure, a History and Physical                            was performed, and patient medications and                            allergies were reviewed. The patient's tolerance of                            previous anesthesia was also reviewed. The risks                            and benefits of the procedure and the sedation                            options and risks were discussed with the patient.                            All questions were answered, and informed consent                            was obtained. Prior Anticoagulants: The patient has                            taken no anticoagulant or antiplatelet agents. ASA                            Grade Assessment: I - A normal, healthy patient.                            After reviewing the risks and benefits, the patient                            was deemed in satisfactory condition to undergo the                            procedure.                           After obtaining informed consent, the endoscope was  passed under direct vision. Throughout the                            procedure, the patient's blood pressure, pulse, and                            oxygen saturations were monitored continuously. The                            Olympus Scope [id number] was introduced through the                            mouth, and advanced to the second part of duodenum.                            The  upper GI endoscopy was accomplished without                            difficulty. The patient tolerated the procedure                            poorly due to the patient's position intolerance. Scope In: Scope Out: Findings:                 No gross lesions were noted in the entire                            esophagus. Biopsies were taken with a cold forceps                            for histology to rule out EoE/LoE. After the rest                            of the EGD was completed, a guidewire was placed                            and the scope was withdrawn. Dilation was performed                            with a Savary dilator with no resistance at 18 mm.                            The dilation site was examined following endoscope                            reinsertion and showed no change.                           A non-obstructing Schatzki ring was found at the                            gastroesophageal junction. Disruption biopsies were  taken with a cold forceps for histology.                           The Z-line was regular and was found 35 cm from the                            incisors.                           A 3 cm hiatal hernia was present.                           Patchy mild inflammation characterized by erosions,                            friability and granularity was found in the entire                            examined stomach. Biopsies were taken with a cold                            forceps for histology and Helicobacter pylori                            testing.                           No gross lesions were noted in the duodenal bulb,                            in the first portion of the duodenum and in the                            second portion of the duodenum. Biopsies were taken                            with a cold forceps for histology. Complications:            No immediate complications. Estimated Blood Loss:      Estimated blood loss was minimal. Impression:               - No gross lesions in the entire esophagus.                            Biopsied. Dilated.                           - Non-obstructing Schatzki ring. Disruption                            biopsies performed.                           - Z-line regular, 35 cm from the incisors.                           -  3 cm hiatal hernia.                           - Gastritis. Biopsied.                           - No gross lesions in the duodenal bulb, in the                            first portion of the duodenum and in the second                            portion of the duodenum. Biopsied. Recommendation:           - The patient will be observed post-procedure,                            until all discharge criteria are met.                           - Discharge patient to home.                           - Patient has a contact number available for                            emergencies. The signs and symptoms of potential                            delayed complications were discussed with the                            patient. Return to normal activities tomorrow.                            Written discharge instructions were provided to the                            patient.                           - Dilation diet as per protocol.                           - Please use Cepacol or Halls Lozenges +/-                            Chloraseptic spray for next 72-96 hours to aid in                            sore thoat should you experience this.                           - Continue present medications.                           -  Await pathology results.                           - Based on results will consider role of PPI                            transition +/- pH Impedence testing if GERD/NERD is                            felt to be playing a role.                           - If dysphagia symptoms improve, then consider                             repeat EGD for dilation if necessary in future                            otherwise consider role of Manometry.                           - The findings and recommendations were discussed                            with the patient's family.                           - The findings and recommendations were discussed                            with the patient. Corliss Parish, MD 11/29/2022 2:08:37 PM

## 2022-11-29 NOTE — Progress Notes (Signed)
GASTROENTEROLOGY PROCEDURE H&P NOTE   Primary Care Physician: Renford Dills, MD  HPI: Pamela Simpson is a 42 y.o. female who presents for EGD for evaluation of GERD and dysphagia and abdominal pain epigastric.  Past Medical History:  Diagnosis Date   GERD (gastroesophageal reflux disease)    Sleep apnea    uses CPAP   Past Surgical History:  Procedure Laterality Date   CESAREAN SECTION N/A 01/10/2020   Procedure: CESAREAN SECTION;  Surgeon: Geryl Rankins, MD;  Location: [facility] LD ORS;  Service: Obstetrics;  Laterality: N/A;  Failure to progress   LIPOMA EXCISION     Current Outpatient Medications  Medication Sig Dispense Refill   acetaminophen (TYLENOL) 500 MG tablet Take 2 tablets (1,000 mg total) by mouth every 6 (six) hours. 30 tablet 0   ALPRAZolam (XANAX) 0.25 MG tablet Take 1 tablet (0.25 mg total) by mouth daily as needed. (Patient not taking: Reported on 11/08/2022) 15 tablet 0   ALPRAZolam (XANAX) 0.25 MG tablet Take 1 tablet (0.25 mg total) by mouth daily as needed. (Patient not taking: Reported on 11/08/2022) 30 tablet 1   COVID-19 At Home Antigen Test (CARESTART COVID-19 HOME TEST) KIT Use as directed within package. (Patient not taking: Reported on 11/08/2022) 4 each 0   escitalopram (LEXAPRO) 10 MG tablet Take 1 tablet (10 mg total) by mouth daily. (Patient not taking: Reported on 11/08/2022) 30 tablet 11   escitalopram (LEXAPRO) 20 MG tablet Take 1 tablet (20 mg total) by mouth daily. (Patient not taking: Reported on 11/08/2022) 90 tablet 3   fluticasone (FLONASE ALLERGY RELIEF) 50 MCG/ACT nasal spray Place 1 spray into both nostrils daily. 32 g 3   fluticasone (FLONASE) 50 MCG/ACT nasal spray PLACE 2 SPRAYS INTO EACH NOSTRIL DAILY 16 g 11   levocetirizine (XYZAL) 5 MG tablet Take 5 mg by mouth every evening.     Norethindrone-Ethinyl Estradiol-Fe Biphas (LO LOESTRIN FE) 1 MG-10 MCG / 10 MCG tablet Take 1 tablet by mouth daily. May skip placebo pills (Patient not  taking: Reported on 11/29/2022) 112 tablet 4   Norethindrone-Ethinyl Estradiol-Fe Biphas (LO LOESTRIN FE) 1 MG-10 MCG / 10 MCG tablet Take 1 tablet by mouth daily, may skip placebo pills (Patient not taking: Reported on 11/29/2022) 112 tablet 4   pantoprazole (PROTONIX) 40 MG tablet Take 40 mg by mouth daily.     pantoprazole (PROTONIX) 40 MG tablet TAKE 1 TABLET BY MOUTH ONCE DAILY. 90 tablet 2   pantoprazole (PROTONIX) 40 MG tablet Take 1 tablet (40 mg total) by mouth daily. 90 tablet 3   pantoprazole (PROTONIX) 40 MG tablet Take 1 tablet (40 mg total) by mouth daily. 90 tablet 3   Prenatal Vit-Fe Fumarate-FA (PRENATAL MULTIVITAMIN) TABS tablet Take 1 tablet by mouth daily at 12 noon. (Patient not taking: Reported on 11/08/2022)     sertraline (ZOLOFT) 25 MG tablet Take 1 tablet (25 mg total) by mouth daily. (Patient not taking: Reported on 11/29/2022) 90 tablet 0   Current Facility-Administered Medications  Medication Dose Route Frequency Provider Last Rate Last Admin   0.9 %  sodium chloride infusion  500 mL Intravenous Continuous Mansouraty, Netty Starring., MD       dextrose 5 % solution   Intravenous Continuous Mansouraty, Netty Starring., MD        Current Outpatient Medications:    acetaminophen (TYLENOL) 500 MG tablet, Take 2 tablets (1,000 mg total) by mouth every 6 (six) hours., Disp: 30 tablet, Rfl: 0   ALPRAZolam (  XANAX) 0.25 MG tablet, Take 1 tablet (0.25 mg total) by mouth daily as needed. (Patient not taking: Reported on 11/08/2022), Disp: 15 tablet, Rfl: 0   ALPRAZolam (XANAX) 0.25 MG tablet, Take 1 tablet (0.25 mg total) by mouth daily as needed. (Patient not taking: Reported on 11/08/2022), Disp: 30 tablet, Rfl: 1   COVID-19 At Home Antigen Test (CARESTART COVID-19 HOME TEST) KIT, Use as directed within package. (Patient not taking: Reported on 11/08/2022), Disp: 4 each, Rfl: 0   escitalopram (LEXAPRO) 10 MG tablet, Take 1 tablet (10 mg total) by mouth daily. (Patient not taking: Reported on  11/08/2022), Disp: 30 tablet, Rfl: 11   escitalopram (LEXAPRO) 20 MG tablet, Take 1 tablet (20 mg total) by mouth daily. (Patient not taking: Reported on 11/08/2022), Disp: 90 tablet, Rfl: 3   fluticasone (FLONASE ALLERGY RELIEF) 50 MCG/ACT nasal spray, Place 1 spray into both nostrils daily., Disp: 32 g, Rfl: 3   fluticasone (FLONASE) 50 MCG/ACT nasal spray, PLACE 2 SPRAYS INTO EACH NOSTRIL DAILY, Disp: 16 g, Rfl: 11   levocetirizine (XYZAL) 5 MG tablet, Take 5 mg by mouth every evening., Disp: , Rfl:    Norethindrone-Ethinyl Estradiol-Fe Biphas (LO LOESTRIN FE) 1 MG-10 MCG / 10 MCG tablet, Take 1 tablet by mouth daily. May skip placebo pills (Patient not taking: Reported on 11/29/2022), Disp: 112 tablet, Rfl: 4   Norethindrone-Ethinyl Estradiol-Fe Biphas (LO LOESTRIN FE) 1 MG-10 MCG / 10 MCG tablet, Take 1 tablet by mouth daily, may skip placebo pills (Patient not taking: Reported on 11/29/2022), Disp: 112 tablet, Rfl: 4   pantoprazole (PROTONIX) 40 MG tablet, Take 40 mg by mouth daily., Disp: , Rfl:    pantoprazole (PROTONIX) 40 MG tablet, TAKE 1 TABLET BY MOUTH ONCE DAILY., Disp: 90 tablet, Rfl: 2   pantoprazole (PROTONIX) 40 MG tablet, Take 1 tablet (40 mg total) by mouth daily., Disp: 90 tablet, Rfl: 3   pantoprazole (PROTONIX) 40 MG tablet, Take 1 tablet (40 mg total) by mouth daily., Disp: 90 tablet, Rfl: 3   Prenatal Vit-Fe Fumarate-FA (PRENATAL MULTIVITAMIN) TABS tablet, Take 1 tablet by mouth daily at 12 noon. (Patient not taking: Reported on 11/08/2022), Disp: , Rfl:    sertraline (ZOLOFT) 25 MG tablet, Take 1 tablet (25 mg total) by mouth daily. (Patient not taking: Reported on 11/29/2022), Disp: 90 tablet, Rfl: 0  Current Facility-Administered Medications:    0.9 %  sodium chloride infusion, 500 mL, Intravenous, Continuous, Mansouraty, Netty Starring., MD   dextrose 5 % solution, , Intravenous, Continuous, Mansouraty, Netty Starring., MD No Known Allergies Family History  Problem Relation Age of  Onset   Heart attack Mother    Stroke Father    Diabetes Father    Healthy Sister    Colon cancer Neg Hx    Esophageal cancer Neg Hx    Rectal cancer Neg Hx    Stomach cancer Neg Hx    Social History   Socioeconomic History   Marital status: Married    Spouse name: Not on file   Number of children: 0   Years of education: Not on file   Highest education level: Not on file  Occupational History   Occupation: RN clinical lead    Employer: [employer]  Tobacco Use   Smoking status: Never   Smokeless tobacco: Never  Vaping Use   Vaping Use: Never used  Substance and Sexual Activity   Alcohol use: Yes    Comment: socially   Drug use: Never  Sexual activity: Yes  Other Topics Concern   Not on file  Social History Narrative   Not on file   Social Determinants of Health   Financial Resource Strain: Not on file  Food Insecurity: Not on file  Transportation Needs: Not on file  Physical Activity: Not on file  Stress: Not on file  Social Connections: Not on file  Intimate Partner Violence: Not on file    Physical Exam: Today's Vitals   11/29/22 1248  BP: 125/77  Pulse: 85  Temp: 98 F (36.7 C)  TempSrc: Temporal  SpO2: 98%  Weight: 172 lb (78 kg)  Height: 5\' 5"  (1.651 m)   Body mass index is 28.62 kg/m. GEN: NAD EYE: Sclerae anicteric ENT: MMM CV: Non-tachycardic GI: Soft, NT/ND NEURO:  Alert & Oriented x 3  Lab Results: No results for input(s): "WBC", "HGB", "HCT", "PLT" in the last 72 hours. BMET No results for input(s): "NA", "K", "CL", "CO2", "GLUCOSE", "BUN", "CREATININE", "CALCIUM" in the last 72 hours. LFT No results for input(s): "PROT", "ALBUMIN", "AST", "ALT", "ALKPHOS", "BILITOT", "BILIDIR", "IBILI" in the last 72 hours. PT/INR No results for input(s): "LABPROT", "INR" in the last 72 hours.   Impression / Plan: This is a 42 y.o.female who presents for EGD for evaluation of GERD and dysphagia and abdominal pain epigastric.  The risks and  benefits of endoscopic evaluation/treatment were discussed with the patient and/or family; these include but are not limited to the risk of perforation, infection, bleeding, missed lesions, lack of diagnosis, severe illness requiring hospitalization, as well as anesthesia and sedation related illnesses.  The patient's history has been reviewed, patient examined, no change in status, and deemed stable for procedure.  The patient and/or family is agreeable to proceed.    Corliss Parish, MD [facility] Advanced Endoscopy Office # [phone]

## 2022-11-30 ENCOUNTER — Telehealth: Payer: Self-pay | Admitting: *Deleted

## 2022-11-30 NOTE — Telephone Encounter (Signed)
  Follow up Call-     11/29/2022   12:48 PM  Call back number  Post procedure Call Back phone  # [phone]  Permission to leave phone message Yes     Patient questions:  Do you have a fever, pain , or abdominal swelling? No. Pain Score  0 *  Have you tolerated food without any problems? Yes.    Have you been able to return to your normal activities? Yes.    Do you have any questions about your discharge instructions: Diet   No. Medications  No. Follow up visit  No.  Do you have questions or concerns about your Care? Yes.   PT did report that just eating the soft foods yesterday ie. A baked potato that she could feel the food "passing through the middle of my chest and is this normal." Spoke with her about waiting for the biopsies to return and that she may notice some improvement with her swallowing within the next few days.  Actions: * If pain score is 4 or above: No action needed, pain <4.

## 2022-12-04 ENCOUNTER — Other Ambulatory Visit: Payer: Self-pay | Admitting: Internal Medicine

## 2022-12-04 DIAGNOSIS — Z1231 Encounter for screening mammogram for malignant neoplasm of breast: Secondary | ICD-10-CM

## 2022-12-06 ENCOUNTER — Encounter: Payer: Self-pay | Admitting: Gastroenterology

## 2023-01-15 ENCOUNTER — Ambulatory Visit
Admission: RE | Admit: 2023-01-15 | Discharge: 2023-01-15 | Disposition: A | Discharge status: Home / Self Care | Payer: 59 | Source: Ambulatory Visit | Attending: Internal Medicine | Admitting: Internal Medicine

## 2023-01-15 DIAGNOSIS — Z1231 Encounter for screening mammogram for malignant neoplasm of breast: Secondary | ICD-10-CM

## 2023-02-06 ENCOUNTER — Other Ambulatory Visit (HOSPITAL_COMMUNITY): Payer: Self-pay

## 2023-02-14 ENCOUNTER — Encounter: Payer: Self-pay | Admitting: Pulmonary Disease

## 2023-02-14 ENCOUNTER — Ambulatory Visit: Payer: 59 | Admitting: Pulmonary Disease

## 2023-02-14 ENCOUNTER — Other Ambulatory Visit (HOSPITAL_COMMUNITY): Payer: Self-pay

## 2023-02-14 VITALS — BP 112/64 | HR 90 | Temp 97.7°F | Ht 64.0 in | Wt 174.0 lb

## 2023-02-14 DIAGNOSIS — G4733 Obstructive sleep apnea (adult) (pediatric): Secondary | ICD-10-CM | POA: Diagnosis not present

## 2023-02-14 MED ORDER — MONTELUKAST SODIUM 10 MG PO TABS
10.0000 mg | ORAL_TABLET | Freq: Every day | ORAL | 2 refills | Status: DC
  Filled 2023-02-14: qty 30, 30d supply, fill #0

## 2023-02-14 NOTE — Patient Instructions (Signed)
Trying to get back to using the CPAP that you have currently The setting on the machine is optimal to give you the lowest pressure to keep the back of your throat open  Continue working on weight loss efforts  Sleeping with the head of the bed elevated may help  Trying to make sure you do not sleep on your back also may help  I will see you between 2 and 3 months and follow-up  Call us with significant concerns  Graded activities/exercise as tolerated

## 2023-02-14 NOTE — Progress Notes (Signed)
Pamela Simpson    [id number]    1980/09/10  Primary Care Physician:Polite, Windy Fast, MD  Referring Physician: Renford Dills, MD [address] [place],  [place] [address]  Chief complaint:   History of obstructive sleep apnea  HPI:  Patient with a history of mild obstructive sleep apnea who did try CPAP in the past and did not tolerate it well Last use CPAP about a year ago  Having recurrence of symptoms Has been told about increased snoring by her spouse  Was diagnosed with mild obstructive sleep apnea in 2021, set up with auto titrating CPAP which she did try but felt the pressure was too much  Usually goes to bed between 9 and 10 PM She is asleep between 10 and 1030 Wakes up about 6:30 in the morning Up once or twice in the night to use the bathroom Admits to night sweats Admits to headaches Admits to daytime sleepiness If the situation allows may sleep for up to 2 hours during the weekend for a nap  She does have reflux, nasal stuffiness for which she is on Flonase, Protonix  Weight has been stable compared to last year  She had had a previous sleep study about 6 that also revealed mild obstructive sleep apnea  No family history of obstructive sleep apnea  Never smoker  [employer] employee  Outpatient Encounter Medications as of 02/14/2023  Medication Sig   fluticasone (FLONASE ALLERGY RELIEF) 50 MCG/ACT nasal spray Place 1 spray into both nostrils daily.   montelukast (SINGULAIR) 10 MG tablet Take 1 tablet (10 mg total) by mouth at bedtime.   pantoprazole (PROTONIX) 40 MG tablet Take 40 mg by mouth daily.   [DISCONTINUED] acetaminophen (TYLENOL) 500 MG tablet Take 2 tablets (1,000 mg total) by mouth every 6 (six) hours. (Patient not taking: Reported on 02/14/2023)   [DISCONTINUED] ALPRAZolam (XANAX) 0.25 MG tablet Take 1 tablet (0.25 mg total) by mouth daily as needed. (Patient not taking: Reported on 11/08/2022)   [DISCONTINUED]  ALPRAZolam (XANAX) 0.25 MG tablet Take 1 tablet (0.25 mg total) by mouth daily as needed. (Patient not taking: Reported on 11/08/2022)   [DISCONTINUED] COVID-19 At Home Antigen Test (CARESTART COVID-19 HOME TEST) KIT Use as directed within package. (Patient not taking: Reported on 11/08/2022)   [DISCONTINUED] escitalopram (LEXAPRO) 10 MG tablet Take 1 tablet (10 mg total) by mouth daily. (Patient not taking: Reported on 11/08/2022)   [DISCONTINUED] escitalopram (LEXAPRO) 20 MG tablet Take 1 tablet (20 mg total) by mouth daily. (Patient not taking: Reported on 11/08/2022)   [DISCONTINUED] fluticasone (FLONASE) 50 MCG/ACT nasal spray PLACE 2 SPRAYS INTO EACH NOSTRIL DAILY (Patient not taking: Reported on 02/14/2023)   [DISCONTINUED] levocetirizine (XYZAL) 5 MG tablet Take 5 mg by mouth every evening. (Patient not taking: Reported on 02/14/2023)   [DISCONTINUED] Norethindrone-Ethinyl Estradiol-Fe Biphas (LO LOESTRIN FE) 1 MG-10 MCG / 10 MCG tablet Take 1 tablet by mouth daily. May skip placebo pills (Patient not taking: Reported on 11/29/2022)   [DISCONTINUED] Norethindrone-Ethinyl Estradiol-Fe Biphas (LO LOESTRIN FE) 1 MG-10 MCG / 10 MCG tablet Take 1 tablet by mouth daily, may skip placebo pills (Patient not taking: Reported on 11/29/2022)   [DISCONTINUED] pantoprazole (PROTONIX) 40 MG tablet TAKE 1 TABLET BY MOUTH ONCE DAILY. (Patient not taking: Reported on 02/14/2023)   [DISCONTINUED] pantoprazole (PROTONIX) 40 MG tablet Take 1 tablet (40 mg total) by mouth daily. (Patient not taking: Reported on 02/14/2023)   [DISCONTINUED] pantoprazole (  PROTONIX) 40 MG tablet Take 1 tablet (40 mg total) by mouth daily. (Patient not taking: Reported on 02/14/2023)   [DISCONTINUED] Prenatal Vit-Fe Fumarate-FA (PRENATAL MULTIVITAMIN) TABS tablet Take 1 tablet by mouth daily at 12 noon. (Patient not taking: Reported on 11/08/2022)   [DISCONTINUED] sertraline (ZOLOFT) 25 MG tablet Take 1 tablet (25 mg total) by mouth daily. (Patient  not taking: Reported on 11/29/2022)   Facility-Administered Encounter Medications as of 02/14/2023  Medication   dextrose 5 % solution    Allergies as of 02/14/2023   (No Known Allergies)    Past Medical History:  Diagnosis Date   GERD (gastroesophageal reflux disease)    Sleep apnea    uses CPAP    Past Surgical History:  Procedure Laterality Date   CESAREAN SECTION N/A 01/10/2020   Procedure: CESAREAN SECTION;  Surgeon: Geryl Rankins, MD;  Location: [facility] LD ORS;  Service: Obstetrics;  Laterality: N/A;  Failure to progress   LIPOMA EXCISION      Family History  Problem Relation Age of Onset   Heart attack Mother    Stroke Father    Diabetes Father    Healthy Sister    Colon cancer Neg Hx    Esophageal cancer Neg Hx    Rectal cancer Neg Hx    Stomach cancer Neg Hx     Social History   Socioeconomic History   Marital status: Married    Spouse name: Not on file   Number of children: 0   Years of education: Not on file   Highest education level: Not on file  Occupational History   Occupation: RN clinical lead    Employer: [employer]  Tobacco Use   Smoking status: Never   Smokeless tobacco: Never  Vaping Use   Vaping status: Never Used  Substance and Sexual Activity   Alcohol use: Yes    Comment: socially   Drug use: Never   Sexual activity: Yes  Other Topics Concern   Not on file  Social History Narrative   Not on file   Social Determinants of Health   Financial Resource Strain: Not on file  Food Insecurity: Not on file  Transportation Needs: Not on file  Physical Activity: Not on file  Stress: Not on file  Social Connections: Not on file  Intimate Partner Violence: Not on file    Review of Systems  Respiratory:  Positive for apnea.   Psychiatric/Behavioral:  Positive for sleep disturbance.     Vitals:   02/14/23 0839  BP: 112/64  Pulse: 90  Temp: 97.7 F (36.5 C)  SpO2: 100%     Physical Exam Constitutional:      Appearance:  Normal appearance.  HENT:     Head: Normocephalic.     Mouth/Throat:     Mouth: Mucous membranes are moist.     Comments: Relative macroglossia, Mallampati 3 Eyes:     General: No scleral icterus.    Pupils: Pupils are equal, round, and reactive to light.  Cardiovascular:     Rate and Rhythm: Normal rate and regular rhythm.     Heart sounds: No murmur heard.    No friction rub.  Pulmonary:     Effort: No respiratory distress.     Breath sounds: No stridor. No wheezing or rhonchi.  Musculoskeletal:     Cervical back: No rigidity or tenderness.  Neurological:     Mental Status: She is alert.  Psychiatric:        Mood and Affect:  Mood normal.    Data Reviewed: Sleep study from 2021 reviewed showing mild obstructive sleep apnea with mild oxygen desaturations  No compliance data available  Assessment:  Mild obstructive sleep apnea will recurrence of symptoms  Did not tolerate CPAP previously because she felt the pressure was still a bit too high  Treatment options for sleep disordered breathing discussed -Options of treatment definitely includes retrying CPAP therapy -Sleeping with the head of bed elevated -Focusing on weight loss efforts -Surgical options for management of sleep disordered breathing discussed  Nasal stuffiness -Continue with nasal steroids -Addition of Singulair  Plan/Recommendations: Prescription for Singulair sent to pharmacy  Reinitiate CPAP therapy -Currently has an auto titrating CPAP set at 5-15 -She does have 2 masks to try from  Encouraged to make sure she puts the CPAP on every night If not tolerated take it off and try the next night  Encouraged weight loss efforts  Encouraged to call with any significant concerns  Follow-up in about 2 to 3 months  I spent 30 minutes dedicated to the care of this patient on the date of this encounter to include previsit review of records, face-to-face time with the patient discussing conditions above,  post visit ordering of testing, clinical documentation with electronic health record.  Virl Diamond MD [facility] 02/14/2023, 8:52 AM  CC: Renford Dills, MD

## 2023-04-06 ENCOUNTER — Encounter: Payer: Self-pay | Admitting: Gastroenterology

## 2023-04-30 ENCOUNTER — Other Ambulatory Visit (HOSPITAL_COMMUNITY): Payer: Self-pay

## 2023-04-30 ENCOUNTER — Other Ambulatory Visit: Payer: Self-pay

## 2023-04-30 ENCOUNTER — Telehealth: Payer: Self-pay | Admitting: Physician Assistant

## 2023-04-30 MED ORDER — PANTOPRAZOLE SODIUM 40 MG PO TBEC
40.0000 mg | DELAYED_RELEASE_TABLET | Freq: Every day | ORAL | 2 refills | Status: DC
Start: 2023-04-30 — End: 2023-07-16
  Filled 2023-04-30: qty 30, 30d supply, fill #0
  Filled 2023-05-25: qty 30, 30d supply, fill #1

## 2023-04-30 NOTE — Telephone Encounter (Signed)
PT is calling to have a refill for protonix sent to  [facility] outpatient pharmacy. She has scheduled ov for 2/24 with Steffanie Dunn. Please advise.

## 2023-05-21 ENCOUNTER — Ambulatory Visit: Payer: 59 | Admitting: Pulmonary Disease

## 2023-05-30 ENCOUNTER — Other Ambulatory Visit (HOSPITAL_COMMUNITY): Payer: Self-pay

## 2023-07-16 ENCOUNTER — Ambulatory Visit: Payer: 59 | Admitting: Physician Assistant

## 2023-07-16 ENCOUNTER — Other Ambulatory Visit (HOSPITAL_COMMUNITY): Payer: Self-pay

## 2023-07-16 ENCOUNTER — Encounter: Payer: Self-pay | Admitting: Physician Assistant

## 2023-07-16 VITALS — BP 102/68 | HR 85 | Ht 64.0 in | Wt 170.2 lb

## 2023-07-16 DIAGNOSIS — K5904 Chronic idiopathic constipation: Secondary | ICD-10-CM | POA: Diagnosis not present

## 2023-07-16 DIAGNOSIS — K219 Gastro-esophageal reflux disease without esophagitis: Secondary | ICD-10-CM

## 2023-07-16 DIAGNOSIS — K2100 Gastro-esophageal reflux disease with esophagitis, without bleeding: Secondary | ICD-10-CM

## 2023-07-16 DIAGNOSIS — D649 Anemia, unspecified: Secondary | ICD-10-CM | POA: Diagnosis not present

## 2023-07-16 MED ORDER — PANTOPRAZOLE SODIUM 40 MG PO TBEC
40.0000 mg | DELAYED_RELEASE_TABLET | Freq: Every day | ORAL | 3 refills | Status: AC
Start: 2023-07-16 — End: ?
  Filled 2023-07-16 – 2023-09-27 (×2): qty 30, 30d supply, fill #0
  Filled 2024-01-06: qty 30, 30d supply, fill #1
  Filled 2024-02-23: qty 30, 30d supply, fill #2
  Filled 2024-04-13: qty 30, 30d supply, fill #3
  Filled 2024-05-26: qty 30, 30d supply, fill #4

## 2023-07-16 NOTE — Patient Instructions (Addendum)
 _______________________________________________________  If your blood pressure at your visit was 140/90 or greater, please contact your primary care physician to follow up on this.  _______________________________________________________  If you are age 41 or older, your body mass index should be between 23-30. Your Body mass index is 29.22 kg/m. If this is out of the aforementioned range listed, please consider follow up with your Primary Care Provider.  If you are age 50 or younger, your body mass index should be between 19-25. Your Body mass index is 29.22 kg/m. If this is out of the aformentioned range listed, please consider follow up with your Primary Care Provider.   ________________________________________________________  The [facility] providers would like to encourage you to use [facility] to communicate with providers for non-urgent requests or questions.  Due to long hold times on the telephone, sending your provider a message by [facility] may be a faster and more efficient way to get a response.  Please allow 48 business hours for a response.  Please remember that this is for non-urgent requests.  _______________________________________________________  Please take your proton pump inhibitor medication, pantoprazole 40 mg   Please take this medication 30 minutes to 1 hour before meals- this makes it more effective.  If you need an AS needed for break through GERD, can do pepcid/famotidine as needed or at night  General lifestyle changes: Avoid spicy and acidic foods Avoid fatty foods Limit your intake of coffee, tea, alcohol, and carbonated drinks Work to maintain a healthy weight Keep the head of the bed elevated at least 3 inches with blocks or a wedge pillow if you are having any nighttime symptoms Stay upright for 2 hours after eating Avoid meals and snacks three to four hours before bedtime  Due for colonoscopy age 30!!

## 2023-07-16 NOTE — Progress Notes (Signed)
 07/16/2023 Pamela Simpson [id number] May 02, 1981  Referring provider: Renford Dills, MD Primary GI doctor: Dr. Meridee Score  ASSESSMENT AND PLAN:   GERD Barium swallow with GERD/HH, no stricture Abdominal ultrasound normal EGD with benign Schatzki ring from acid reflux negative EOE, celiac, H. Pylori Continue PPI   Chronic idiopathic constipation No change in bowel habits, has had constipation for a lot time No hemtochezia, weight loss, lower AB pain - Increase fiber/ water intake, decrease caffeine, increase activity level. -Will add on Miralax daily and Benefiber Due for colonoscopy age 35  Anemia, unspecified type Secondary to menorrhagia, only has menses of 2 days, not heavy, has stopped BCP about 4 months ago 11/08/2022  HGB 12.6 MCV 79.0 Platelets 423.0 11/08/2022 Iron 90 Ferritin 29.6   Patient Care Team: Renford Dills, MD as PCP - General (Internal Medicine)  HISTORY OF PRESENT ILLNESS: 43 y.o. female with a past medical history of GERD, vitamin D def, anxiety, dysmenorrhea and others listed below presents for evaluation of abdominal pain.  Patient presented to primary care Dr. Nehemiah Settle 02/2022 with reflux and dysphagia.  Patient's on Protonix 40 mg once daily since 2021. 02/22/2022 barium swallow with pill shows minimal reducible hiatal hernia without evidence of spontaneous or induced GERD, no evidence of stricture or ulceration. 11/09/2022 abdominal ultrasound normal gallbladder, normal liver unremarkable 12/06/2022 endoscopy showed Schatzki ring disruption biopsies showed evidence of acid reflux with no evidence of Barrett's esophagus, negative for celiac, H. pylori, EOE.  She states her swallowing has improved, she has rare episodes of difficult swallowing, this is intermittent every 2 months. She is on the pantoprazole daily, which controls her gerd. No melena, hemoatochezia, changes in bowel habits. Does not get full quickly, no gas/bloating.   She denies  blood thinner use.  She denies NSAID use or very rare.  She reports ETOH use, rare social use.    She denies tobacco use.  She denies drug use.    She  reports that she has never smoked. She has never used smokeless tobacco. She reports current alcohol use. She reports that she does not use drugs.  RELEVANT LABS AND IMAGING:  11/29/2022 EGD Dr. Meridee Score - No gross lesions in the entire esophagus. Biopsied. Dilated. - Non- obstructing Schatzki ring. Disruption biopsies performed. - Z- line regular, 35 cm from the incisors. - 3 cm hiatal hernia. - Gastritis. Biopsied. - No gross lesions in the duodenal bulb, in the first portion of the duodenum and in the second portion of the duodenum. Biopsied. 1. Surgical [P], esophagus - ESOPHAGEAL SQUAMOUS MUCOSA WITH NO SPECIFIC HISTOPATHOLOGIC CHANGES - NEGATIVE FOR INCREASED INTRAEPITHELIAL EOSINOPHILS 2. Surgical [P], gastric - GASTRIC ANTRAL AND OXYNTIC MUCOSA WITH NO SPECIFIC HISTOPATHOLOGIC CHANGES - HELICOBACTER PYLORI-LIKE ORGANISMS ARE NOT IDENTIFIED ON ROUTINE H&E STAIN 3. Surgical [P], duodenal - DUODENAL MUCOSA WITH NO SPECIFIC HISTOPATHOLOGIC CHANGES - NEGATIVE FOR INCREASED INTRAEPITHELIAL LYMPHOCYTES OR VILLOUS ARCHITECTURAL CHANGES 4. Surgical [P], Schatzki's ring - ESOPHAGEAL SQUAMOUS AND CARDIAC MUCOSA WITH REFLUX-ASSOCIATED CHANGES - NEGATIVE FOR INTESTINAL METAPLASIA OR DYSPLASIA CBC    Component Value Date/Time   WBC 5.0 11/08/2022 0944   RBC 4.88 11/08/2022 0944   HGB 12.6 11/08/2022 0944   HCT 38.6 11/08/2022 0944   PLT 423.0 (H) 11/08/2022 0944   MCV 79.0 11/08/2022 0944   MCH 27.1 08/06/2021 0900   MCHC 32.8 11/08/2022 0944   RDW 14.4 11/08/2022 0944   LYMPHSABS 1.3 11/08/2022 0944   MONOABS 0.3 11/08/2022 0944   EOSABS 0.1 11/08/2022  0944   BASOSABS 0.0 11/08/2022 0944   Recent Labs    11/08/22 0944  HGB 12.6    CMP     Component Value Date/Time   NA 139 11/08/2022 0944   K 3.9 11/08/2022 0944   CL  102 11/08/2022 0944   CO2 30 11/08/2022 0944   GLUCOSE 81 11/08/2022 0944   BUN 12 11/08/2022 0944   CREATININE 0.84 11/08/2022 0944   CALCIUM 9.5 11/08/2022 0944   PROT 7.4 11/08/2022 0944   ALBUMIN 4.2 11/08/2022 0944   AST 9 11/08/2022 0944   ALT 10 11/08/2022 0944   ALKPHOS 26 (L) 11/08/2022 0944   BILITOT 0.3 11/08/2022 0944   GFRNONAA >60 08/06/2021 0900      Latest Ref Rng & Units 11/08/2022    9:44 AM 01/22/2019   10:53 AM  Hepatic Function  Total Protein 6.0 - 8.3 g/dL 7.4  7.2   Albumin 3.5 - 5.2 g/dL 4.2  4.4   AST 0 - 37 U/L 9  11   ALT 0 - 35 U/L 10  11   Alk Phosphatase 39 - 117 U/L 26  37   Total Bilirubin 0.2 - 1.2 mg/dL 0.3  0.4       Current Medications:       Current Outpatient Medications (Respiratory):    fluticasone (FLONASE ALLERGY RELIEF) 50 MCG/ACT nasal spray, Place 1 spray into both nostrils daily.   montelukast (SINGULAIR) 10 MG tablet, Take 1 tablet (10 mg total) by mouth at bedtime.       Current Outpatient Medications (Other):    pantoprazole (PROTONIX) 40 MG tablet, Take 1 tablet (40 mg total) by mouth daily.  Current Facility-Administered Medications (Other):    dextrose 5 % solution  Medical History:  Past Medical History:  Diagnosis Date   GERD (gastroesophageal reflux disease)    Sleep apnea    uses CPAP   Allergies: No Known Allergies   Surgical History:  She  has a past surgical history that includes Lipoma excision and Cesarean section (N/A, 01/10/2020). Family History:  Her family history includes Diabetes in her father; Healthy in her sister; Heart attack in her mother; Stroke in her father.  REVIEW OF SYSTEMS  : Review of Systems  Constitutional:  Negative for chills, fever and weight loss.  HENT:  Negative for sinus pain and sore throat.   Respiratory:  Negative for shortness of breath.   Cardiovascular:  Negative for chest pain and leg swelling.  Gastrointestinal:  Positive for abdominal pain, constipation  and heartburn. Negative for blood in stool, diarrhea, melena, nausea and vomiting.  Neurological:  Negative for weakness.    PHYSICAL EXAM: There were no vitals taken for this visit. General Appearance: Well nourished, in no apparent distress. Head:   Normocephalic and atraumatic. Eyes:  sclerae anicteric,conjunctive pink  Respiratory: Respiratory effort normal, BS equal bilaterally without rales, rhonchi, wheezing. Cardio: RRR with no MRGs. Peripheral pulses intact.  Abdomen: Soft,  Obese ,active bowel sounds. mild tenderness in the epigastrium and in the RUQ. Without guarding and Without rebound. No masses. Rectal: Not evaluated Musculoskeletal: Full ROM, Normal gait. Without edema. Skin:  Dry and intact without significant lesions or rashes Neuro: Alert and  oriented x4;  No focal deficits. Psych:  Cooperative. Normal mood and affect.    Doree Albee, PA-C 8:10 AM

## 2023-07-17 NOTE — Progress Notes (Signed)
 Attending Physician's Attestation   I have reviewed the chart.   I agree with the Advanced Practitioner's note, impression, and recommendations with any updates as below. Glad to hear that she is done better post EGD.  If she has recurrent symptoms in future can repeat endoscopy and dilation/disruption.  If issues of bowel habits continue to be a problem, then earlier colonoscopy can be considered otherwise we will wait till age 43 for typical screening.   Corliss Parish, MD [facility] Advanced Endoscopy Office # [phone]

## 2023-07-26 ENCOUNTER — Other Ambulatory Visit (HOSPITAL_COMMUNITY): Payer: Self-pay

## 2023-09-27 ENCOUNTER — Other Ambulatory Visit (HOSPITAL_COMMUNITY): Payer: Self-pay

## 2023-09-27 ENCOUNTER — Other Ambulatory Visit: Payer: Self-pay

## 2023-12-14 ENCOUNTER — Other Ambulatory Visit: Payer: Self-pay | Admitting: Internal Medicine

## 2023-12-14 DIAGNOSIS — Z1231 Encounter for screening mammogram for malignant neoplasm of breast: Secondary | ICD-10-CM

## 2023-12-20 ENCOUNTER — Other Ambulatory Visit (HOSPITAL_BASED_OUTPATIENT_CLINIC_OR_DEPARTMENT_OTHER): Payer: Self-pay

## 2023-12-20 ENCOUNTER — Other Ambulatory Visit: Payer: Self-pay | Admitting: Adult Health

## 2023-12-20 MED ORDER — AMOXICILLIN 500 MG PO CAPS
500.0000 mg | ORAL_CAPSULE | Freq: Two times a day (BID) | ORAL | 0 refills | Status: AC
  Filled 2023-12-20: qty 20, 10d supply, fill #0

## 2023-12-20 MED ORDER — PREDNISONE 20 MG PO TABS
20.0000 mg | ORAL_TABLET | Freq: Every day | ORAL | 0 refills | Status: AC
  Filled 2023-12-20: qty 7, 7d supply, fill #0

## 2024-01-08 ENCOUNTER — Other Ambulatory Visit (HOSPITAL_BASED_OUTPATIENT_CLINIC_OR_DEPARTMENT_OTHER): Payer: Self-pay

## 2024-01-17 ENCOUNTER — Ambulatory Visit
Admission: RE | Admit: 2024-01-17 | Discharge: 2024-01-17 | Disposition: A | Discharge status: Home / Self Care | Source: Ambulatory Visit | Attending: Internal Medicine | Admitting: Internal Medicine

## 2024-01-17 DIAGNOSIS — Z1231 Encounter for screening mammogram for malignant neoplasm of breast: Secondary | ICD-10-CM

## 2024-03-24 ENCOUNTER — Other Ambulatory Visit (HOSPITAL_COMMUNITY): Payer: Self-pay

## 2024-04-14 ENCOUNTER — Other Ambulatory Visit (HOSPITAL_BASED_OUTPATIENT_CLINIC_OR_DEPARTMENT_OTHER): Payer: Self-pay

## 2024-05-29 ENCOUNTER — Other Ambulatory Visit (HOSPITAL_BASED_OUTPATIENT_CLINIC_OR_DEPARTMENT_OTHER): Payer: Self-pay

## 2024-05-29 MED ORDER — PANTOPRAZOLE SODIUM 40 MG PO TBEC
40.0000 mg | DELAYED_RELEASE_TABLET | Freq: Every day | ORAL | 3 refills | Status: AC
  Filled 2024-05-29: qty 90, 90d supply, fill #0
